# Patient Record
Sex: Female | Born: 1950 | Race: White | Hispanic: No | Smoking: Never smoker
Health system: Southern US, Community
[De-identification: ages and names within clinical notes are randomized; demographics above are authoritative.]

## PROBLEM LIST (undated history)

## (undated) DIAGNOSIS — K589 Irritable bowel syndrome without diarrhea: Secondary | ICD-10-CM

## (undated) DIAGNOSIS — K635 Polyp of colon: Secondary | ICD-10-CM

## (undated) DIAGNOSIS — M549 Dorsalgia, unspecified: Secondary | ICD-10-CM

## (undated) DIAGNOSIS — N200 Calculus of kidney: Secondary | ICD-10-CM

## (undated) DIAGNOSIS — M792 Neuralgia and neuritis, unspecified: Secondary | ICD-10-CM

## (undated) DIAGNOSIS — M25569 Pain in unspecified knee: Secondary | ICD-10-CM

## (undated) DIAGNOSIS — M2550 Pain in unspecified joint: Secondary | ICD-10-CM

## (undated) DIAGNOSIS — M1990 Unspecified osteoarthritis, unspecified site: Secondary | ICD-10-CM

## (undated) DIAGNOSIS — F329 Major depressive disorder, single episode, unspecified: Secondary | ICD-10-CM

## (undated) DIAGNOSIS — R918 Other nonspecific abnormal finding of lung field: Secondary | ICD-10-CM

## (undated) DIAGNOSIS — E785 Hyperlipidemia, unspecified: Secondary | ICD-10-CM

## (undated) DIAGNOSIS — I10 Essential (primary) hypertension: Secondary | ICD-10-CM

## (undated) DIAGNOSIS — K219 Gastro-esophageal reflux disease without esophagitis: Secondary | ICD-10-CM

## (undated) DIAGNOSIS — F32A Depression, unspecified: Secondary | ICD-10-CM

## (undated) HISTORY — DX: Unspecified osteoarthritis, unspecified site: M19.90

## (undated) HISTORY — DX: Polyp of colon: K63.5

## (undated) HISTORY — DX: Other nonspecific abnormal finding of lung field: R91.8

## (undated) HISTORY — DX: Calculus of kidney: N20.0

## (undated) HISTORY — DX: Pain in unspecified joint: M25.50

## (undated) HISTORY — DX: Essential (primary) hypertension: I10

## (undated) HISTORY — DX: Hyperlipidemia, unspecified: E78.5

## (undated) HISTORY — PX: BREAST REDUCTION SURGERY: SHX8

## (undated) HISTORY — DX: Dorsalgia, unspecified: M54.9

## (undated) HISTORY — PX: CARPAL TUNNEL RELEASE: SHX101

## (undated) HISTORY — PX: LUMBAR LAMINECTOMY: SHX95

## (undated) HISTORY — DX: Major depressive disorder, single episode, unspecified: F32.9

## (undated) HISTORY — DX: Irritable bowel syndrome, unspecified: K58.9

## (undated) HISTORY — PX: CHOLECYSTECTOMY: SHX55

## (undated) HISTORY — DX: Depression, unspecified: F32.A

## (undated) HISTORY — DX: Pain in unspecified knee: M25.569

## (undated) HISTORY — DX: Gastro-esophageal reflux disease without esophagitis: K21.9

## (undated) HISTORY — DX: Neuralgia and neuritis, unspecified: M79.2

---

## 1998-06-02 ENCOUNTER — Other Ambulatory Visit: Admission: RE | Admit: 1998-06-02 | Discharge: 1998-06-02 | Payer: Self-pay | Admitting: Obstetrics and Gynecology

## 2001-05-25 ENCOUNTER — Encounter (INDEPENDENT_AMBULATORY_CARE_PROVIDER_SITE_OTHER): Payer: Self-pay

## 2001-05-25 ENCOUNTER — Ambulatory Visit (HOSPITAL_COMMUNITY): Admission: RE | Admit: 2001-05-25 | Discharge: 2001-05-26 | Payer: Self-pay | Admitting: General Surgery

## 2001-05-25 ENCOUNTER — Encounter: Payer: Self-pay | Admitting: General Surgery

## 2002-01-03 ENCOUNTER — Encounter: Payer: Self-pay | Admitting: Neurosurgery

## 2002-01-03 ENCOUNTER — Ambulatory Visit (HOSPITAL_COMMUNITY): Admission: RE | Admit: 2002-01-03 | Discharge: 2002-01-03 | Payer: Self-pay | Admitting: Neurosurgery

## 2002-01-09 ENCOUNTER — Encounter: Payer: Self-pay | Admitting: Neurosurgery

## 2002-01-09 ENCOUNTER — Observation Stay (HOSPITAL_COMMUNITY): Admission: RE | Admit: 2002-01-09 | Discharge: 2002-01-10 | Payer: Self-pay | Admitting: Neurosurgery

## 2002-02-21 ENCOUNTER — Other Ambulatory Visit: Admission: RE | Admit: 2002-02-21 | Discharge: 2002-02-21 | Payer: Self-pay | Admitting: *Deleted

## 2003-02-03 ENCOUNTER — Ambulatory Visit (HOSPITAL_COMMUNITY): Admission: RE | Admit: 2003-02-03 | Discharge: 2003-02-03 | Payer: Self-pay | Admitting: Specialist

## 2003-02-03 ENCOUNTER — Encounter (INDEPENDENT_AMBULATORY_CARE_PROVIDER_SITE_OTHER): Payer: Self-pay | Admitting: Specialist

## 2003-02-03 ENCOUNTER — Ambulatory Visit (HOSPITAL_BASED_OUTPATIENT_CLINIC_OR_DEPARTMENT_OTHER): Admission: RE | Admit: 2003-02-03 | Discharge: 2003-02-03 | Payer: Self-pay | Admitting: Specialist

## 2003-02-26 ENCOUNTER — Other Ambulatory Visit: Admission: RE | Admit: 2003-02-26 | Discharge: 2003-02-26 | Payer: Self-pay | Admitting: Obstetrics and Gynecology

## 2004-04-04 HISTORY — PX: LUMBAR FUSION: SHX111

## 2004-09-08 ENCOUNTER — Emergency Department (HOSPITAL_COMMUNITY): Admission: EM | Admit: 2004-09-08 | Discharge: 2004-09-08 | Payer: Self-pay | Admitting: Emergency Medicine

## 2004-12-09 ENCOUNTER — Ambulatory Visit: Payer: Self-pay | Admitting: Family Medicine

## 2004-12-14 ENCOUNTER — Ambulatory Visit: Payer: Self-pay | Admitting: Family Medicine

## 2004-12-16 ENCOUNTER — Ambulatory Visit: Payer: Self-pay | Admitting: Gastroenterology

## 2004-12-21 ENCOUNTER — Ambulatory Visit: Payer: Self-pay | Admitting: Family Medicine

## 2004-12-30 ENCOUNTER — Ambulatory Visit: Payer: Self-pay | Admitting: Gastroenterology

## 2004-12-30 ENCOUNTER — Encounter (INDEPENDENT_AMBULATORY_CARE_PROVIDER_SITE_OTHER): Payer: Self-pay | Admitting: *Deleted

## 2005-01-10 ENCOUNTER — Ambulatory Visit: Payer: Self-pay | Admitting: Family Medicine

## 2005-02-28 ENCOUNTER — Inpatient Hospital Stay (HOSPITAL_COMMUNITY): Admission: RE | Admit: 2005-02-28 | Discharge: 2005-03-04 | Payer: Self-pay | Admitting: Neurosurgery

## 2006-02-20 ENCOUNTER — Ambulatory Visit: Payer: Self-pay | Admitting: Family Medicine

## 2006-02-20 LAB — CONVERTED CEMR LAB
ALT: 19 units/L (ref 0–40)
AST: 26 units/L (ref 0–37)
Albumin: 4 g/dL (ref 3.5–5.2)
Alkaline Phosphatase: 93 units/L (ref 39–117)
BUN: 18 mg/dL (ref 6–23)
Basophils Absolute: 0 10*3/uL (ref 0.0–0.1)
Basophils Relative: 0.5 % (ref 0.0–1.0)
CO2: 29 meq/L (ref 19–32)
Calcium: 9.4 mg/dL (ref 8.4–10.5)
Chloride: 106 meq/L (ref 96–112)
Chol/HDL Ratio, serum: 4.1
Cholesterol: 205 mg/dL (ref 0–200)
Creatinine, Ser: 0.8 mg/dL (ref 0.4–1.2)
Eosinophil percent: 5.9 % — ABNORMAL HIGH (ref 0.0–5.0)
GFR calc non Af Amer: 79 mL/min
Glomerular Filtration Rate, Af Am: 96 mL/min/{1.73_m2}
Glucose, Bld: 99 mg/dL (ref 70–99)
HCT: 41.7 % (ref 36.0–46.0)
HDL: 49.9 mg/dL (ref >39.0)
Hemoglobin: 14.3 g/dL (ref 12.0–15.0)
LDL DIRECT: 155.7 mg/dL
Lymphocytes Relative: 32.7 % (ref 12.0–46.0)
MCHC: 34.2 g/dL (ref 30.0–36.0)
MCV: 90.3 fL (ref 78.0–100.0)
Monocytes Absolute: 0.3 10*3/uL (ref 0.2–0.7)
Monocytes Relative: 5 % (ref 3.0–11.0)
Neutro Abs: 3.7 10*3/uL (ref 1.4–7.7)
Neutrophils Relative %: 55.9 % (ref 43.0–77.0)
Platelets: 277 10*3/uL (ref 150–400)
Potassium: 4.7 meq/L (ref 3.5–5.1)
RBC: 4.61 M/uL (ref 3.87–5.11)
RDW: 13.1 % (ref 11.5–14.6)
Sodium: 141 meq/L (ref 135–145)
TSH: 3.36 microintl units/mL (ref 0.35–5.50)
Total Bilirubin: 0.7 mg/dL (ref 0.3–1.2)
Total Protein: 7 g/dL (ref 6.0–8.3)
Triglyceride fasting, serum: 55 mg/dL (ref 0–149)
VLDL: 11 mg/dL (ref 0–40)
WBC: 6.5 10*3/uL (ref 4.5–10.5)

## 2006-02-28 ENCOUNTER — Other Ambulatory Visit: Admission: RE | Admit: 2006-02-28 | Discharge: 2006-02-28 | Payer: Self-pay | Admitting: Family Medicine

## 2006-02-28 ENCOUNTER — Encounter: Payer: Self-pay | Admitting: Family Medicine

## 2006-02-28 ENCOUNTER — Ambulatory Visit: Payer: Self-pay | Admitting: Family Medicine

## 2006-03-16 ENCOUNTER — Ambulatory Visit: Payer: Self-pay | Admitting: Family Medicine

## 2006-06-13 ENCOUNTER — Ambulatory Visit: Payer: Self-pay | Admitting: Family Medicine

## 2006-06-27 ENCOUNTER — Ambulatory Visit: Payer: Self-pay | Admitting: Family Medicine

## 2006-06-27 LAB — CONVERTED CEMR LAB
ALT: 21 units/L (ref 0–40)
AST: 27 units/L (ref 0–37)
Albumin: 4 g/dL (ref 3.5–5.2)
Alkaline Phosphatase: 95 units/L (ref 39–117)
Bilirubin, Direct: 0.1 mg/dL (ref 0.0–0.3)
Cholesterol: 143 mg/dL (ref 0–200)
HDL: 45.8 mg/dL (ref >39.0)
LDL Cholesterol: 73 mg/dL (ref 0–99)
Total Bilirubin: 0.6 mg/dL (ref 0.3–1.2)
Total CHOL/HDL Ratio: 3.1
Total Protein: 6.9 g/dL (ref 6.0–8.3)
Triglycerides: 123 mg/dL (ref 0–149)
VLDL: 25 mg/dL (ref 0–40)

## 2006-12-18 ENCOUNTER — Encounter: Payer: Self-pay | Admitting: Family Medicine

## 2007-01-05 ENCOUNTER — Encounter: Payer: Self-pay | Admitting: Family Medicine

## 2007-01-10 ENCOUNTER — Ambulatory Visit: Payer: Self-pay | Admitting: Family Medicine

## 2007-01-10 DIAGNOSIS — R42 Dizziness and giddiness: Secondary | ICD-10-CM

## 2007-01-10 DIAGNOSIS — K219 Gastro-esophageal reflux disease without esophagitis: Secondary | ICD-10-CM

## 2007-01-10 DIAGNOSIS — B351 Tinea unguium: Secondary | ICD-10-CM

## 2007-02-08 ENCOUNTER — Telehealth: Payer: Self-pay | Admitting: Family Medicine

## 2007-02-21 ENCOUNTER — Ambulatory Visit: Payer: Self-pay | Admitting: Family Medicine

## 2007-05-18 ENCOUNTER — Ambulatory Visit: Payer: Self-pay | Admitting: Family Medicine

## 2007-05-18 DIAGNOSIS — F332 Major depressive disorder, recurrent severe without psychotic features: Secondary | ICD-10-CM | POA: Insufficient documentation

## 2007-05-29 ENCOUNTER — Encounter: Payer: Self-pay | Admitting: Family Medicine

## 2007-05-30 ENCOUNTER — Encounter: Payer: Self-pay | Admitting: Family Medicine

## 2007-06-07 ENCOUNTER — Ambulatory Visit: Payer: Self-pay | Admitting: Family Medicine

## 2007-06-07 LAB — CONVERTED CEMR LAB
Bilirubin Urine: NEGATIVE
Glucose, Urine, Semiquant: NEGATIVE
Ketones, urine, test strip: NEGATIVE
Nitrite: NEGATIVE
Specific Gravity, Urine: 1.025
Urobilinogen, UA: 0.2
pH: 6

## 2007-06-08 LAB — CONVERTED CEMR LAB
ALT: 20 units/L (ref 0–35)
AST: 28 units/L (ref 0–37)
Albumin: 4 g/dL (ref 3.5–5.2)
Alkaline Phosphatase: 90 units/L (ref 39–117)
BUN: 16 mg/dL (ref 6–23)
Basophils Absolute: 0.1 10*3/uL (ref 0.0–0.1)
Basophils Relative: 1.2 % — ABNORMAL HIGH (ref 0.0–1.0)
Bilirubin, Direct: 0.2 mg/dL (ref 0.0–0.3)
CO2: 28 meq/L (ref 19–32)
Calcium: 9.5 mg/dL (ref 8.4–10.5)
Chloride: 107 meq/L (ref 96–112)
Cholesterol: 142 mg/dL (ref 0–200)
Creatinine, Ser: 0.7 mg/dL (ref 0.4–1.2)
Eosinophils Absolute: 0.4 10*3/uL (ref 0.0–0.6)
Eosinophils Relative: 6.3 % — ABNORMAL HIGH (ref 0.0–5.0)
GFR calc Af Amer: 111 mL/min
GFR calc non Af Amer: 92 mL/min
Glucose, Bld: 96 mg/dL (ref 70–99)
HCT: 40.8 % (ref 36.0–46.0)
HDL: 45.6 mg/dL (ref >39.0)
Hemoglobin: 13.9 g/dL (ref 12.0–15.0)
LDL Cholesterol: 77 mg/dL (ref 0–99)
Lymphocytes Relative: 25.2 % (ref 12.0–46.0)
MCHC: 34.1 g/dL (ref 30.0–36.0)
MCV: 90.4 fL (ref 78.0–100.0)
Monocytes Absolute: 0.4 10*3/uL (ref 0.2–0.7)
Monocytes Relative: 6 % (ref 3.0–11.0)
Neutro Abs: 4 10*3/uL (ref 1.4–7.7)
Neutrophils Relative %: 61.3 % (ref 43.0–77.0)
Platelets: 240 10*3/uL (ref 150–400)
Potassium: 4.1 meq/L (ref 3.5–5.1)
RBC: 4.51 M/uL (ref 3.87–5.11)
RDW: 12.3 % (ref 11.5–14.6)
Sodium: 141 meq/L (ref 135–145)
TSH: 2.91 microintl units/mL (ref 0.35–5.50)
Total Bilirubin: 0.8 mg/dL (ref 0.3–1.2)
Total CHOL/HDL Ratio: 3.1
Total Protein: 6.7 g/dL (ref 6.0–8.3)
Triglycerides: 98 mg/dL (ref 0–149)
VLDL: 20 mg/dL (ref 0–40)
WBC: 6.6 10*3/uL (ref 4.5–10.5)

## 2007-06-18 ENCOUNTER — Encounter: Payer: Self-pay | Admitting: Family Medicine

## 2007-06-18 ENCOUNTER — Other Ambulatory Visit: Admission: RE | Admit: 2007-06-18 | Discharge: 2007-06-18 | Payer: Self-pay | Admitting: Family Medicine

## 2007-06-18 ENCOUNTER — Ambulatory Visit: Payer: Self-pay | Admitting: Family Medicine

## 2007-06-18 DIAGNOSIS — F329 Major depressive disorder, single episode, unspecified: Secondary | ICD-10-CM

## 2007-06-18 DIAGNOSIS — E785 Hyperlipidemia, unspecified: Secondary | ICD-10-CM

## 2007-06-18 DIAGNOSIS — M1990 Unspecified osteoarthritis, unspecified site: Secondary | ICD-10-CM

## 2007-11-26 ENCOUNTER — Encounter: Payer: Self-pay | Admitting: Family Medicine

## 2007-11-26 ENCOUNTER — Telehealth: Payer: Self-pay | Admitting: Family Medicine

## 2008-01-08 ENCOUNTER — Telehealth: Payer: Self-pay | Admitting: Family Medicine

## 2008-01-10 ENCOUNTER — Ambulatory Visit: Payer: Self-pay | Admitting: Family Medicine

## 2008-01-14 LAB — CONVERTED CEMR LAB
Free T4: 0.7 ng/dL (ref 0.6–1.6)
T3, Free: 2.8 pg/mL (ref 2.3–4.2)
TSH: 4.36 microintl units/mL (ref 0.35–5.50)

## 2008-04-01 ENCOUNTER — Telehealth: Payer: Self-pay | Admitting: Family Medicine

## 2008-06-19 ENCOUNTER — Encounter: Payer: Self-pay | Admitting: Family Medicine

## 2008-12-16 ENCOUNTER — Ambulatory Visit: Payer: Self-pay | Admitting: Family Medicine

## 2008-12-16 LAB — CONVERTED CEMR LAB
Bilirubin Urine: NEGATIVE
Blood in Urine, dipstick: NEGATIVE
Glucose, Urine, Semiquant: NEGATIVE
Ketones, urine, test strip: NEGATIVE
Nitrite: NEGATIVE
Protein, U semiquant: NEGATIVE
Specific Gravity, Urine: 1.02
Urobilinogen, UA: 0.2
WBC Urine, dipstick: NEGATIVE
pH: 7

## 2008-12-18 LAB — CONVERTED CEMR LAB
ALT: 26 units/L (ref 0–35)
AST: 33 units/L (ref 0–37)
Albumin: 4 g/dL (ref 3.5–5.2)
Alkaline Phosphatase: 77 units/L (ref 39–117)
BUN: 22 mg/dL (ref 6–23)
Basophils Absolute: 0.1 10*3/uL (ref 0.0–0.1)
Basophils Relative: 1.5 % (ref 0.0–3.0)
Bilirubin, Direct: 0 mg/dL (ref 0.0–0.3)
CO2: 32 meq/L (ref 19–32)
Calcium: 9.4 mg/dL (ref 8.4–10.5)
Chloride: 108 meq/L (ref 96–112)
Cholesterol: 164 mg/dL (ref 0–200)
Creatinine, Ser: 0.6 mg/dL (ref 0.4–1.2)
Eosinophils Absolute: 0.4 10*3/uL (ref 0.0–0.7)
Eosinophils Relative: 6.7 % — ABNORMAL HIGH (ref 0.0–5.0)
GFR calc non Af Amer: 109.23 mL/min (ref >60)
Glucose, Bld: 96 mg/dL (ref 70–99)
HCT: 40.5 % (ref 36.0–46.0)
HDL: 55.4 mg/dL (ref >39.00)
Hemoglobin: 13.8 g/dL (ref 12.0–15.0)
LDL Cholesterol: 97 mg/dL (ref 0–99)
Lymphocytes Relative: 33.1 % (ref 12.0–46.0)
Lymphs Abs: 2 10*3/uL (ref 0.7–4.0)
MCHC: 34.1 g/dL (ref 30.0–36.0)
MCV: 92.5 fL (ref 78.0–100.0)
Monocytes Absolute: 0.4 10*3/uL (ref 0.1–1.0)
Monocytes Relative: 6.3 % (ref 3.0–12.0)
Neutro Abs: 3 10*3/uL (ref 1.4–7.7)
Neutrophils Relative %: 52.4 % (ref 43.0–77.0)
Platelets: 233 10*3/uL (ref 150.0–400.0)
Potassium: 4.4 meq/L (ref 3.5–5.1)
RBC: 4.38 M/uL (ref 3.87–5.11)
RDW: 12.1 % (ref 11.5–14.6)
Sodium: 145 meq/L (ref 135–145)
TSH: 3.33 microintl units/mL (ref 0.35–5.50)
Total Bilirubin: 0.7 mg/dL (ref 0.3–1.2)
Total CHOL/HDL Ratio: 3
Total Protein: 7.2 g/dL (ref 6.0–8.3)
Triglycerides: 58 mg/dL (ref 0.0–149.0)
VLDL: 11.6 mg/dL (ref 0.0–40.0)
WBC: 5.9 10*3/uL (ref 4.5–10.5)

## 2008-12-23 ENCOUNTER — Ambulatory Visit: Payer: Self-pay | Admitting: Family Medicine

## 2009-01-12 ENCOUNTER — Ambulatory Visit: Payer: Self-pay | Admitting: Family Medicine

## 2009-06-25 ENCOUNTER — Encounter: Payer: Self-pay | Admitting: Family Medicine

## 2009-11-12 ENCOUNTER — Encounter (INDEPENDENT_AMBULATORY_CARE_PROVIDER_SITE_OTHER): Payer: Self-pay | Admitting: *Deleted

## 2009-11-17 ENCOUNTER — Ambulatory Visit: Payer: Self-pay | Admitting: Family Medicine

## 2009-11-19 ENCOUNTER — Ambulatory Visit: Payer: Self-pay | Admitting: Family Medicine

## 2009-12-08 ENCOUNTER — Encounter (INDEPENDENT_AMBULATORY_CARE_PROVIDER_SITE_OTHER): Payer: Self-pay | Admitting: *Deleted

## 2009-12-25 ENCOUNTER — Telehealth: Payer: Self-pay | Admitting: Family Medicine

## 2009-12-25 ENCOUNTER — Ambulatory Visit: Payer: Self-pay | Admitting: Internal Medicine

## 2009-12-25 ENCOUNTER — Ambulatory Visit: Payer: Self-pay | Admitting: Family Medicine

## 2009-12-25 ENCOUNTER — Other Ambulatory Visit: Admission: RE | Admit: 2009-12-25 | Discharge: 2009-12-25 | Payer: Self-pay | Admitting: Family Medicine

## 2009-12-25 DIAGNOSIS — R1031 Right lower quadrant pain: Secondary | ICD-10-CM

## 2009-12-25 DIAGNOSIS — J984 Other disorders of lung: Secondary | ICD-10-CM

## 2009-12-25 LAB — CONVERTED CEMR LAB
Bilirubin Urine: NEGATIVE
Glucose, Urine, Semiquant: NEGATIVE
Ketones, urine, test strip: NEGATIVE
Nitrite: NEGATIVE
Specific Gravity, Urine: 1.01
Urobilinogen, UA: 0.2
pH: 6

## 2009-12-28 LAB — CONVERTED CEMR LAB
ALT: 18 units/L (ref 0–35)
AST: 27 units/L (ref 0–37)
Albumin: 4.2 g/dL (ref 3.5–5.2)
Alkaline Phosphatase: 78 units/L (ref 39–117)
Amylase: 22 units/L — ABNORMAL LOW (ref 27–131)
BUN: 19 mg/dL (ref 6–23)
Basophils Absolute: 0.1 10*3/uL (ref 0.0–0.1)
Basophils Relative: 1.1 % (ref 0.0–3.0)
Bilirubin, Direct: 0.1 mg/dL (ref 0.0–0.3)
CO2: 25 meq/L (ref 19–32)
Calcium: 9.2 mg/dL (ref 8.4–10.5)
Chloride: 101 meq/L (ref 96–112)
Creatinine, Ser: 0.6 mg/dL (ref 0.4–1.2)
Eosinophils Absolute: 0.6 10*3/uL (ref 0.0–0.7)
Eosinophils Relative: 5.9 % — ABNORMAL HIGH (ref 0.0–5.0)
GFR calc non Af Amer: 102.88 mL/min (ref >60)
Glucose, Bld: 88 mg/dL (ref 70–99)
HCT: 40.5 % (ref 36.0–46.0)
Hemoglobin: 13.9 g/dL (ref 12.0–15.0)
Lymphocytes Relative: 21.9 % (ref 12.0–46.0)
Lymphs Abs: 2.2 10*3/uL (ref 0.7–4.0)
MCHC: 34.5 g/dL (ref 30.0–36.0)
MCV: 91.2 fL (ref 78.0–100.0)
Monocytes Absolute: 0.6 10*3/uL (ref 0.1–1.0)
Monocytes Relative: 5.5 % (ref 3.0–12.0)
Neutro Abs: 6.6 10*3/uL (ref 1.4–7.7)
Neutrophils Relative %: 65.6 % (ref 43.0–77.0)
Platelets: 286 10*3/uL (ref 150.0–400.0)
Potassium: 3.9 meq/L (ref 3.5–5.1)
RBC: 4.44 M/uL (ref 3.87–5.11)
RDW: 12.9 % (ref 11.5–14.6)
Sodium: 138 meq/L (ref 135–145)
TSH: 2.77 microintl units/mL (ref 0.35–5.50)
Total Bilirubin: 0.4 mg/dL (ref 0.3–1.2)
Total Protein: 7.2 g/dL (ref 6.0–8.3)
WBC: 10.1 10*3/uL (ref 4.5–10.5)

## 2009-12-29 LAB — CONVERTED CEMR LAB: Pap Smear: NEGATIVE

## 2010-01-05 ENCOUNTER — Ambulatory Visit: Payer: Self-pay | Admitting: Family Medicine

## 2010-01-05 LAB — CONVERTED CEMR LAB
Bilirubin Urine: NEGATIVE
Glucose, Urine, Semiquant: NEGATIVE
Ketones, urine, test strip: NEGATIVE
Nitrite: NEGATIVE
Protein, U semiquant: NEGATIVE
Specific Gravity, Urine: <1.005
Urobilinogen, UA: 0.2
WBC Urine, dipstick: NEGATIVE
pH: 5.5

## 2010-01-07 LAB — CONVERTED CEMR LAB
Cholesterol: 151 mg/dL (ref 0–200)
HDL: 52.9 mg/dL (ref >39.00)
LDL Cholesterol: 81 mg/dL (ref 0–99)
Total CHOL/HDL Ratio: 3
Triglycerides: 86 mg/dL (ref 0.0–149.0)
VLDL: 17.2 mg/dL (ref 0.0–40.0)

## 2010-01-13 ENCOUNTER — Ambulatory Visit: Payer: Self-pay | Admitting: Family Medicine

## 2010-01-13 ENCOUNTER — Encounter: Payer: Self-pay | Admitting: Family Medicine

## 2010-01-13 DIAGNOSIS — N200 Calculus of kidney: Secondary | ICD-10-CM | POA: Insufficient documentation

## 2010-01-14 ENCOUNTER — Encounter (INDEPENDENT_AMBULATORY_CARE_PROVIDER_SITE_OTHER): Payer: Self-pay

## 2010-01-18 ENCOUNTER — Encounter: Payer: Self-pay | Admitting: Family Medicine

## 2010-01-18 ENCOUNTER — Ambulatory Visit: Payer: Self-pay | Admitting: Gastroenterology

## 2010-01-29 ENCOUNTER — Telehealth (INDEPENDENT_AMBULATORY_CARE_PROVIDER_SITE_OTHER): Payer: Self-pay | Admitting: *Deleted

## 2010-02-04 ENCOUNTER — Ambulatory Visit: Payer: Self-pay | Admitting: Gastroenterology

## 2010-02-18 ENCOUNTER — Ambulatory Visit (HOSPITAL_COMMUNITY): Admission: RE | Admit: 2010-02-18 | Discharge: 2010-02-18 | Payer: Self-pay | Admitting: Urology

## 2010-03-17 ENCOUNTER — Encounter: Payer: Self-pay | Admitting: Family Medicine

## 2010-05-04 NOTE — Letter (Signed)
Summary: [facility] Instructions  [facility]  [address] [place], [place] [address]   Phone: [phone]  Fax: [phone]       Robin Howe    Aug 03, 1950    MRN: [id number]        Procedure Day Dorna Bloom:  Lenor Coffin  02/04/10     Arrival Time:  8:30AM      Procedure Time:  9:30AM     [facility] of Procedure:                    [facility]  PREPARATION FOR COLONOSCOPY WITH MOVIPREP   Starting 5 days prior to your procedure 01/30/10 do not eat nuts, seeds, popcorn, corn, beans, peas,  salads, or any raw vegetables.  Do not take any fiber supplements (e.g. Metamucil, Citrucel, and Benefiber).  THE DAY BEFORE YOUR PROCEDURE         DATE: 02/03/10  DAY: WEDNESDAY  1.  Drink clear liquids the entire day-NO SOLID FOOD  2.  Do not drink anything colored red or purple.  Avoid juices with pulp.  No orange juice.  3.  Drink at least 64 oz. (8 glasses) of fluid/clear liquids during the day to prevent dehydration and help the prep work efficiently.  CLEAR LIQUIDS INCLUDE: Water Jello Ice Popsicles Tea (sugar ok, no milk/cream) Powdered fruit flavored drinks Coffee (sugar ok, no milk/cream) Gatorade Juice: apple, white grape, white cranberry  Lemonade Clear bullion, consomm, broth Carbonated beverages (any kind) Strained chicken noodle soup Hard Candy                             4.  In the morning, mix first dose of MoviPrep solution:    Empty 1 Pouch A and 1 Pouch B into the disposable container    Add lukewarm drinking water to the top line of the container. Mix to dissolve    Refrigerate (mixed solution should be used within 24 hrs)  5.  Begin drinking the prep at 5:00 p.m. The MoviPrep container is divided by 4 marks.   Every 15 minutes drink the solution down to the next mark (approximately 8 oz) until the full liter is complete.   6.  Follow completed prep with 16 oz of clear liquid of your choice (Nothing red or purple).   Continue to drink clear liquids until bedtime.  7.  Before going to bed, mix second dose of MoviPrep solution:    Empty 1 Pouch A and 1 Pouch B into the disposable container    Add lukewarm drinking water to the top line of the container. Mix to dissolve    Refrigerate  THE DAY OF YOUR PROCEDURE      DATE: 02/04/10  DAY: THURSDAY  Beginning at 4:30AM (5 hours before procedure):         1. Every 15 minutes, drink the solution down to the next mark (approx 8 oz) until the full liter is complete.  2. Follow completed prep with 16 oz. of clear liquid of your choice.    3. You may drink clear liquids until 7:30AM (2 HOURS BEFORE PROCEDURE).   MEDICATION INSTRUCTIONS  Unless otherwise instructed, you should take regular prescription medications with a small sip of water   as early as possible the morning of your procedure.  Diabetic patients - see separate instructions.  Stop taking Plavix or Aggrenox on  _  _  (  7 days before procedure).     Stop taking Coumadin on  _ _  (5 days before procedure).  Additional medication instructions: _         OTHER INSTRUCTIONS  You will need a responsible adult at least 60 years of age to accompany you and drive you home.   This person must remain in the waiting room during your procedure.  Wear loose fitting clothing that is easily removed.  Leave jewelry and other valuables at home.  However, you may wish to bring a book to read or  an iPod/MP3 player to listen to music as you wait for your procedure to start.  Remove all body piercing jewelry and leave at home.  Total time from sign-in until discharge is approximately 2-3 hours.  You should go home directly after your procedure and rest.  You can resume normal activities the  day after your procedure.  The day of your procedure you should not:   Drive   Make legal decisions   Operate machinery   Drink alcohol   Return to work  You will receive specific instructions  about eating, activities and medications before you leave.    The above instructions have been reviewed and explained to me by   _______________________    I fully understand and can verbalize these instructions _____________________________ Date _________

## 2010-05-04 NOTE — Assessment & Plan Note (Signed)
Summary: form completion---tb skin test//ccm   Vital Signs:  Patient profile:   60 year old female Weight:      193 pounds BMI:     40.83 BP sitting:   120 / 80  (left arm) Cuff size:   regular  Vitals Entered By: Raechel Ache, RN (November 17, 2009 8:30 AM) CC: Needs form for teaching.   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 She feels fine and has no concerns. She stopped sertraline about 2 months ago, and her moods are fine.   Allergies: 1)  ! * Bextra  Past History:  Past Medical History: Reviewed history from 06/18/2007 and no changes required. Hyperlipidemia Depression IBS GERD Osteoarthritis  Review of Systems  The patient denies anorexia, fever, weight loss, vision loss, decreased hearing, hoarseness, chest pain, syncope, dyspnea on exertion, peripheral edema, prolonged cough, headaches, hemoptysis, abdominal pain, melena, hematochezia, severe indigestion/heartburn, hematuria, incontinence, genital sores, muscle weakness, suspicious skin lesions, transient blindness, difficulty walking, depression, unusual weight change, abnormal bleeding, enlarged lymph nodes, angioedema, breast masses, and testicular masses.    Physical Exam  General:  overweight-appearing.   Neck:  No deformities, masses, or tenderness noted. Lungs:  Normal respiratory effort, chest expands symmetrically. Lungs are clear to auscultation, no crackles or wheezes. Heart:  Normal rate and regular rhythm. S1 and S2 normal without gallop, murmur, click, rub or other extra sounds.   Impression & Recommendations:  Problem # 1:  MORBID OBESITY (ICD-278.01)  Problem # 2:  DEPRESSION (ICD-311)  The following medications were removed from the medication list:    Sertraline Hcl 50 Mg Tabs (Sertraline hcl) ..... Once daily  Complete Medication List: 1)  Crestor 20 Mg Tabs (Rosuvastatin calcium) .Marland Kitchen.. 1 tablet by mouth daily 2)  Omeprazole 20 Mg Cpdr  (Omeprazole) .... One by mouth daily 3)  Fish Oil Oil (Fish oil) .... Once daily 4)  Vitamin B-12 1000 Mcg Tabs (Cyanocobalamin) .... Once daily 5)  Green Tea 250 Mg Caps (Green tea (camillia sinensis)) .... Once daily  Other Orders: TB Skin Test [phone]) Admin 1st Vaccine (91478)  Patient Instructions: 1)  to return in 48 hours to read the PPD. Set up a cpx soon   Immunizations Administered:  PPD Skin Test:    Vaccine Type: PPD    Site: left forearm    Mfr: Sanofi Pasteur    Dose: 0.1 ml    Route: ID    Given by: Raechel Ache, RN    Exp. Date: 02/04/2011    Lot #: [id number]

## 2010-05-04 NOTE — Consult Note (Signed)
Summary: [facility]  [facility]   Imported By: Maryln Gottron 01/26/2010 13:13:57  _____________________________________________________________________  External Attachment:    Type:   Image     Comment:   External Document

## 2010-05-04 NOTE — Progress Notes (Signed)
Summary: Pharmacy did not receive prep  ---- Converted from flag ---- ---- 01/29/2010 9:23 AM, Vallarie Mare wrote: [facility] [phone] did not receive prep order ------------------------------  Appended Document: Pharmacy did not receive prep [facility] [address] [place], [place].

## 2010-05-04 NOTE — Assessment & Plan Note (Signed)
Summary: tb reading/cb  Nurse Visit   Vitals Entered By: Raechel Ache, RN (November 19, 2009 9:15 AM)  Allergies: 1)  ! * Bextra  PPD Results    Date of reading: 11/19/2009    Results: < 5mm    Interpretation: negative

## 2010-05-04 NOTE — Progress Notes (Signed)
Summary: call report radiology   Phone Note From Other Clinic   Caller: [facility]  Summary of Call: call report from [place] from radiology  ___ 4mm obstructing stone.  [facility].  instructed pt to use coffee filters to strain urine and lots of H2O. Initial call taken by: Pura Spice, RN,  December 25, 2009 2:05 PM  Follow-up for Phone Call        call  her in some Vicodin 5/500 q 6 hours as needed pain , #60 with no rf Follow-up by: Nelwyn Salisbury MD,  December 25, 2009 3:11 PM  Additional Follow-up for Phone Call Additional follow up Details #1::        done pt awaree. Additional Follow-up by: Pura Spice, RN,  December 25, 2009 3:44 PM    New/Updated Medications: VICODIN 5-500 MG TABS (HYDROCODONE-ACETAMINOPHEN) 1 by mouth every 6 hrs pain Prescriptions: VICODIN 5-500 MG TABS (HYDROCODONE-ACETAMINOPHEN) 1 by mouth every 6 hrs pain  #60 x 0   Entered by:   Pura Spice, RN   Authorized by:   Nelwyn Salisbury MD   Signed by:   Pura Spice, RN on 12/25/2009   Method used:   Telephoned to ...       [facility]  [address] (retail)       [address]       [place], [place]  [address]       Ph: [phone] or [phone]       Fax: [phone]   RxID:   [id number]

## 2010-05-04 NOTE — Letter (Signed)
Summary: Colonoscopy Letter  [facility]  [address] [place], [place] [address]   Phone: [phone]  Fax: [phone]      November 12, 2009 MRN: [id number]   [facility] [address] [place], [place]  [address]   Dear Robin Howe,   According to your medical record, it is time for you to schedule a Colonoscopy. The American Cancer Society recommends this procedure as a method to detect early colon cancer. Patients with a family history of colon cancer, or a personal history of colon polyps or inflammatory bowel disease are at increased risk.  This letter has beeen generated based on the recommendations made at the time of your procedure. If you feel that in your particular situation this may no longer apply, please contact our office.  Please call our office at [phone] to schedule this appointment or to update your records at your earliest convenience.  Thank you for cooperating with [place] to provide you with the very best care possible.   Sincerely,  Judie Petit T. Russella Dar, M.D.  [facility] [phone]

## 2010-05-04 NOTE — Assessment & Plan Note (Signed)
Summary: right lower quad pain//ccm   Vital Signs:  Patient profile:   60 year old female Menstrual status:  postmenopausal Weight:      196 pounds O2 Sat:      95 % Temp:     98.4 degrees F Pulse rate:   90 / minute BP sitting:   130 / 90  Vitals Entered By: Pura Spice, RN (December 25, 2009 8:51 AM) CC: RLQ pain worse after eating  Is Patient Diabetic? No     Menstrual Status postmenopausal   History of Present Illness: Here for one week of intermittent RLQ and right flank pains. These are severe at times, and milder at other times. The pain seems to get worse after she eats. Appetite is normal, no nausea, no fever. Her BMs are normal, soft, has 1-2 a day. No change in urinations. She is fasting this am. She is scheduled  for a colonoscopy in November.   Allergies: 1)  ! * Bextra  Past History:  Past Medical History: Reviewed history from 06/18/2007 and no changes required. Hyperlipidemia Depression IBS GERD Osteoarthritis  Past Surgical History: Reviewed history from 06/18/2007 and no changes required. Carpal tunnel releases, bilateral , per Dr. Mikal Plane Caesarean section colonoscopy 12-30-04 per Dr. Russella Dar, repeat in 5 yrs Lumbar laminectomy Lumbar fusion per Dr. Franky Macho Cholecystectomy breast reductions  Review of Systems  The patient denies anorexia, fever, weight loss, weight gain, vision loss, decreased hearing, hoarseness, chest pain, syncope, dyspnea on exertion, peripheral edema, prolonged cough, headaches, hemoptysis, melena, hematochezia, severe indigestion/heartburn, hematuria, incontinence, genital sores, muscle weakness, suspicious skin lesions, transient blindness, difficulty walking, depression, unusual weight change, abnormal bleeding, enlarged lymph nodes, angioedema, breast masses, and testicular masses.    Physical Exam  General:  overweight-appearing.  in NAD Abdomen:  soft, normal bowel sounds, no distention, no masses, no guarding, no  rigidity, no rebound tenderness, no abdominal hernia, no inguinal hernia, no hepatomegaly, and no splenomegaly.  Moderately tender in the right flank, the RLQ, and at the umbilicus.  Rectal:  No external abnormalities noted. Normal sphincter tone. No rectal masses or tenderness. Heme neg.  Genitalia:  Pelvic Exam:        External: normal female genitalia without lesions or masses        Vagina: normal without lesions or masses        Cervix: normal without lesions or masses        Adnexa: normal bimanual exam without masses or fullness        Uterus: normal by palpation        Pap smear: performed   Impression & Recommendations:  Problem # 1:  ABDOMINAL PAIN, RIGHT LOWER QUADRANT (ICD-789.03)  Orders: Hemoccult Guaiac-1 spec.(in office) ([address]70) UA Dipstick w/o Micro (manual) ([address]) Venipuncture ([address]) TLB-BMP (Basic Metabolic Panel-BMET) (80048-METABOL) TLB-CBC Platelet - w/Differential (85025-CBCD) TLB-Hepatic/Liver Function Pnl (80076-HEPATIC) TLB-TSH (Thyroid Stimulating Hormone) (84443-TSH) TLB-Amylase (82150-AMYL)  Complete Medication List: 1)  Crestor 20 Mg Tabs (Rosuvastatin calcium) .Marland Kitchen.. 1 tablet by mouth daily 2)  Omeprazole 20 Mg Cpdr (Omeprazole) .... One by mouth daily 3)  Fish Oil Oil (Fish oil) .... Once daily 4)  Vitamin B-12 1000 Mcg Tabs (Cyanocobalamin) .... Once daily 5)  Green Tea 250 Mg Caps (Green tea (camillia sinensis)) .... Once daily  Other Orders: Radiology Referral (Radiology)  Patient Instructions: 1)  This is most likely a ureteral stone that is hung up at the right UPJ, but we need to rule out appendicitis. get labs now  and then set up a contrasted abdomen and pelvic CT.   Laboratory Results   Urine Tests  Date/Time Received: 9:40 AM  Date/Time Reported: December 25, 2009   Routine Urinalysis   Color: yellow Appearance: Clear Glucose: negative   (Normal Range: Negative) Bilirubin: negative   (Normal Range: Negative) Ketone:  negative   (Normal Range: Negative) Spec. Gravity: 1.010   (Normal Range: 1.003-1.035) Blood: large   (Normal Range: Negative) pH: 6.0   (Normal Range: 5.0-8.0) Protein: trace   (Normal Range: Negative) Urobilinogen: 0.2   (Normal Range: 0-1) Nitrite: negative   (Normal Range: Negative) Leukocyte Esterace: trace   (Normal Range: Negative)    Comments: Pura Spice, RN  December 25, 2009 9:43 AM

## 2010-05-04 NOTE — Procedures (Signed)
Summary: Colonoscopy  Patient: Robin Howe Note: All result statuses are Final unless otherwise noted.  Tests: (1) Colonoscopy (COL)   COL Colonoscopy           DONE     [facility]     [address]     [place], [place]  [address]           COLONOSCOPY PROCEDURE REPORT     PATIENT:  Robin, Howe  MR#:  [id number]     BIRTHDATE:  05-Mar-1951, 58 yrs. old  GENDER:  female     ENDOSCOPIST:  Judie Petit T. Russella Dar, MD, [facility]           PROCEDURE DATE:  02/04/2010     PROCEDURE:  Colonoscopy 10932     ASA CLASS:  Class II     INDICATIONS:  1) surveillance and high-risk screening  2) history     of adenomatous colon polyp: 12/2004.     MEDICATIONS:   Fentanyl 100 mcg IV, Versed 9 mg IV     DESCRIPTION OF PROCEDURE:   After the risks benefits and     alternatives of the procedure were thoroughly explained, informed     consent was obtained.  Digital rectal exam was performed and     revealed no abnormalities.   The LB [id number] endoscope     was introduced through the anus and advanced to the cecum, which     was identified by both the appendix and ileocecal valve, without     limitations.  The quality of the prep was excellent, using     MoviPrep.  The instrument was then slowly withdrawn as the colon     was fully examined.     <<PROCEDUREIMAGES>>     FINDINGS:  A normal appearing cecum, ileocecal valve, and     appendiceal orifice were identified. The ascending, hepatic     flexure, transverse, splenic flexure, descending, sigmoid colon,     and rectum appeared unremarkable.   Retroflexed views in the     rectum revealed no abnormalities.    The time to cecum =  3.25     minutes. The scope was then withdrawn (time =  10  min) from the     patient and the procedure completed.     COMPLICATIONS:  None           ENDOSCOPIC IMPRESSION:     1) Normal colon           RECOMMENDATIONS:     1) Repeat Colonoscopy in 5 years.           Venita Lick. Russella Dar, MD, [facility]          CC: Nelwyn Salisbury, MD           n.     Rosalie DoctorVenita Lick. Stark at 02/04/2010 09:48 AM           Hilma Favors, [id number]  Note: An exclamation mark (!) indicates a result that was not dispersed into the flowsheet. Document Creation Date: 02/04/2010 9:48 AM _______________________________________________________________________  (1) Order result status: Final Collection or observation date-time: 02/04/2010 09:44 Requested date-time:  Receipt date-time:  Reported date-time:  Referring Physician:   Ordering Physician: Claudette Head [phone]) Specimen Source:  Source: Launa Grill Order Number: [id number] Lab site:   Appended Document: Colonoscopy    Clinical Lists Changes  Observations: Added new observation of COLONNXTDUE: 02/2015 (02/04/2010 14:41)

## 2010-05-04 NOTE — Miscellaneous (Signed)
Summary: [facility] previsit prep  Clinical Lists Changes  Medications: Added new medication of MOVIPREP 100 GM  SOLR (PEG-KCL-NACL-NASULF-NA ASC-C) Observations: Added new observation of ALLERGY REV: Done (01/18/2010 8:03)

## 2010-05-04 NOTE — Assessment & Plan Note (Signed)
Summary: cpx --? pap//ccm   Vital Signs:  Patient profile:   60 year old female Menstrual status:  postmenopausal Height:      57.75 inches Weight:      197 pounds BMI:     41.68 O2 Sat:      96 % Temp:     98.3 degrees F Pulse rate:   84 / minute BP sitting:   140 / 80  Vitals Entered By: Pura Spice, RN (January 13, 2010 1:24 PM) CC: cpx no pap     History of Present Illness: 60 yr old female for a cpx and to follow up a right sided ureteral stone. She was here on 12-25-09 with sharp RLQ pains. A CT was obtained which showed a 4 mm stone lodged at the right UVJ causing some obstruction. Her labs were all normal. the CT also showed numerous tiny nodules in both lung bases. We have planned to get a 6 month follow up chest CT in March 2012. She is drinking water and straining the urine, but she does not think she has passed the stone. She still gets occasional lancing type pains in the RLQ. No nausea or fever. Urinating and having BMs regularly.   Allergies: 1)  ! * Bextra  Past History:  Past Medical History: Reviewed history from 06/18/2007 and no changes required. Hyperlipidemia Depression IBS GERD Osteoarthritis  Past Surgical History: Reviewed history from 06/18/2007 and no changes required. Carpal tunnel releases, bilateral , per Dr. Mikal Plane Caesarean section colonoscopy 12-30-04 per Dr. Russella Dar, repeat in 5 yrs Lumbar laminectomy Lumbar fusion per Dr. Franky Macho Cholecystectomy breast reductions  Family History: Reviewed history from 06/18/2007 and no changes required. Family History Hypertension Family History Lung cancer Family History of Prostate CA 1st degree relative <50  Social History: Reviewed history from 06/18/2007 and no changes required. Married Never Smoked Alcohol use-no Drug use-no Regular exercise-no  Review of Systems  The patient denies anorexia, fever, weight loss, weight gain, vision loss, decreased hearing, hoarseness, chest pain,  syncope, dyspnea on exertion, peripheral edema, prolonged cough, headaches, hemoptysis, melena, hematochezia, severe indigestion/heartburn, hematuria, incontinence, genital sores, muscle weakness, suspicious skin lesions, transient blindness, difficulty walking, depression, unusual weight change, abnormal bleeding, enlarged lymph nodes, angioedema, breast masses, and testicular masses.         Flu Vaccine Consent Questions     Do you have a history of severe allergic reactions to this vaccine? no    Any prior history of allergic reactions to egg and/or gelatin? no    Do you have a sensitivity to the preservative Thimersol? no    Do you have a past history of Guillan-Barre Syndrome? no    Do you currently have an acute febrile illness? no    Have you ever had a severe reaction to latex? no    Vaccine information given and explained to patient? yes    Are you currently pregnant? no    Lot Number:[id number]   Exp Date:10/02/2010   Site Given  Left Deltoid IM Pura Spice, RN  January 13, 2010 1:25 PM   Physical Exam  General:  overweight-appearing.   Head:  Normocephalic and atraumatic without obvious abnormalities. No apparent alopecia or balding. Eyes:  No corneal or conjunctival inflammation noted. EOMI. Perrla. Funduscopic exam benign, without hemorrhages, exudates or papilledema. Vision grossly normal. Ears:  External ear exam shows no significant lesions or deformities.  Otoscopic examination reveals clear canals, tympanic membranes are intact bilaterally without bulging, retraction, inflammation  or discharge. Hearing is grossly normal bilaterally. Nose:  External nasal examination shows no deformity or inflammation. Nasal mucosa are pink and moist without lesions or exudates. Mouth:  Oral mucosa and oropharynx without lesions or exudates.  Teeth in good repair. Neck:  No deformities, masses, or tenderness noted. Chest Wall:  No deformities, masses, or tenderness noted. Breasts:  No  mass, nodules, thickening, tenderness, bulging, retraction, inflamation, nipple discharge or skin changes noted.   Lungs:  Normal respiratory effort, chest expands symmetrically. Lungs are clear to auscultation, no crackles or wheezes. Heart:  Normal rate and regular rhythm. S1 and S2 normal without gallop, murmur, click, rub or other extra sounds. EKG normal Abdomen:  Bowel sounds positive,abdomen soft and non-tender without masses, organomegaly or hernias noted. Msk:  No deformity or scoliosis noted of thoracic or lumbar spine.   Pulses:  R and L carotid,radial,femoral,dorsalis pedis and posterior tibial pulses are full and equal bilaterally Extremities:  No clubbing, cyanosis, edema, or deformity noted with normal full range of motion of all joints.   Neurologic:  No cranial nerve deficits noted. Station and gait are normal. Plantar reflexes are down-going bilaterally. DTRs are symmetrical throughout. Sensory, motor and coordinative functions appear intact. Skin:  Intact without suspicious lesions or rashes Cervical Nodes:  No lymphadenopathy noted Axillary Nodes:  No palpable lymphadenopathy Inguinal Nodes:  No significant adenopathy Psych:  Cognition and judgment appear intact. Alert and cooperative with normal attention span and concentration. No apparent delusions, illusions, hallucinations   Impression & Recommendations:  Problem # 1:  WELL ADULT EXAM (ICD-V70.0)  Orders: EKG w/ Interpretation (93000)  Problem # 2:  NEPHROLITHIASIS (ICD-592.0)  Orders: Urology Referral (Urology)  Complete Medication List: 1)  Crestor 20 Mg Tabs (Rosuvastatin calcium) .Marland Kitchen.. 1 tablet by mouth daily 2)  Omeprazole 20 Mg Cpdr (Omeprazole) .... One by mouth daily 3)  Fish Oil Oil (Fish oil) .... Once daily 4)  Vitamin B-12 1000 Mcg Tabs (Cyanocobalamin) .... Once daily 5)  Green Tea 250 Mg Caps (Green tea (camillia sinensis)) .... Once daily 6)  Vicodin 5-500 Mg Tabs (Hydrocodone-acetaminophen)  .Marland Kitchen.. 1 by mouth every 6 hrs pain  Other Orders: Admin 1st Vaccine (16109) Flu Vaccine 84yrs + (60454)  Patient Instructions: 1)  It is important that you exercise reguarly at least 20 minutes 5 times a week. If you develop chest pain, have severe difficulty breathing, or feel very tired, stop exercising immediately and seek medical attention.  2)  You need to lose weight. Consider a lower calorie diet and regular exercise.  3)  we will refer her to Urology for a retained stone Prescriptions: OMEPRAZOLE 20 MG CPDR (OMEPRAZOLE) one by mouth daily  #30 x 11   Entered and Authorized by:   Nelwyn Salisbury MD   Signed by:   Nelwyn Salisbury MD on 01/13/2010   Method used:   Electronically to        [facility] (retail)       [address]       [place], [place]  [address]       Ph: [phone] or [phone]       Fax: [phone]   RxID:   [id number] CRESTOR 20 MG TABS (ROSUVASTATIN CALCIUM) 1 tablet by mouth daily  #30 x 11   Entered and Authorized by:   Nelwyn Salisbury MD   Signed by:   Nelwyn Salisbury MD on 01/13/2010   Method used:   [facility] to  [facility] (retail)       [address]       [place], [place]  [address]       Ph: [phone] or [phone]       Fax: [phone]   RxID:   [id number]

## 2010-05-04 NOTE — Letter (Signed)
Summary: Pre Visit Letter Revised  [facility]  [address] [place], [place] [address]   Phone: [phone]  Fax: [phone]        12/08/2009 MRN: [id number] [facility] [address] [place], [place]  [address]             Procedure Date:  02/04/2010   Welcome to the Gastroenterology Division at [facility].    You are scheduled to see a nurse for your pre-procedure visit on 01/18/2010 at 8:00AM on the 3rd floor at [facility], [address]  We ask that you try to arrive at our office 15 minutes prior to your appointment time to allow for check-in.  Please take a minute to review the attached form.  If you answer "Yes" to one or more of the questions on the first page, we ask that you call the person listed at your earliest opportunity.  If you answer "No" to all of the questions, please complete the rest of the form and bring it to your appointment.    Your nurse visit will consist of discussing your medical and surgical history, your immediate family medical history, and your medications.   If you are unable to list all of your medications on the form, please bring the medication bottles to your appointment and we will list them.  We will need to be aware of both prescribed and over the counter drugs.  We will need to know exact dosage information as well.    Please be prepared to read and sign documents such as consent forms, a financial agreement, and acknowledgement forms.  If necessary, and with your consent, a friend or relative is welcome to sit-in on the nurse visit with you.  Please bring your insurance card so that we may make a copy of it.  If your insurance requires a referral to see a specialist, please bring your referral form from your primary care physician.  No co-pay is required for this nurse visit.     If you cannot keep your appointment, please call [phone] to cancel or reschedule prior to your appointment date.  This allows  [place] the opportunity to schedule an appointment for another patient in need of care.    Thank you for choosing [facility] for your medical needs.  We appreciate the opportunity to care for you.  Please visit [place] at our website  to learn more about our practice.  Sincerely, The Gastroenterology Division

## 2010-05-06 NOTE — Letter (Signed)
Summary: [facility]  [facility]   Imported By: Maryln Gottron 03/30/2010 12:41:09  _____________________________________________________________________  External Attachment:    Type:   Image     Comment:   External Document

## 2010-05-17 ENCOUNTER — Ambulatory Visit (HOSPITAL_COMMUNITY)
Admission: RE | Admit: 2010-05-17 | Discharge: 2010-05-17 | Disposition: A | Discharge status: Home / Self Care | Payer: BC Managed Care – PPO | Source: Ambulatory Visit | Attending: Urology | Admitting: Urology

## 2010-05-17 ENCOUNTER — Ambulatory Visit (HOSPITAL_COMMUNITY): Payer: BC Managed Care – PPO

## 2010-05-17 DIAGNOSIS — K219 Gastro-esophageal reflux disease without esophagitis: Secondary | ICD-10-CM | POA: Insufficient documentation

## 2010-05-17 DIAGNOSIS — Z9089 Acquired absence of other organs: Secondary | ICD-10-CM | POA: Insufficient documentation

## 2010-05-17 DIAGNOSIS — Z79899 Other long term (current) drug therapy: Secondary | ICD-10-CM | POA: Insufficient documentation

## 2010-05-17 DIAGNOSIS — N201 Calculus of ureter: Secondary | ICD-10-CM | POA: Insufficient documentation

## 2010-05-17 DIAGNOSIS — E7800 Pure hypercholesterolemia, unspecified: Secondary | ICD-10-CM | POA: Insufficient documentation

## 2010-06-22 ENCOUNTER — Telehealth: Payer: Self-pay

## 2010-06-22 DIAGNOSIS — R918 Other nonspecific abnormal finding of lung field: Secondary | ICD-10-CM

## 2010-06-22 NOTE — Telephone Encounter (Signed)
Pt called and stated needs order for chest ct From notes EMR pt had contrast ct and revealed "tiny nodules in lung bases and re commendation was to repeat chest ct 6 months.  Order sent terri Call pt at [phone]

## 2010-06-29 ENCOUNTER — Ambulatory Visit (INDEPENDENT_AMBULATORY_CARE_PROVIDER_SITE_OTHER)
Admission: RE | Admit: 2010-06-29 | Discharge: 2010-06-29 | Disposition: A | Discharge status: Home / Self Care | Payer: BC Managed Care – PPO | Source: Ambulatory Visit | Attending: Family Medicine | Admitting: Family Medicine

## 2010-06-29 ENCOUNTER — Telehealth: Payer: Self-pay

## 2010-06-29 DIAGNOSIS — R918 Other nonspecific abnormal finding of lung field: Secondary | ICD-10-CM

## 2010-06-29 MED ORDER — IOHEXOL 300 MG/ML  SOLN
80.0000 mL | Freq: Once | INTRAMUSCULAR | Status: AC | PRN
  Administered 2010-06-29: 80 mL via INTRAVENOUS

## 2010-06-29 NOTE — Telephone Encounter (Signed)
Received CT chest report on desktop and Dr Clent Ridges aware  This was follow up 6 month  From 12-25-2009  Pls review report and advise.  Thanks

## 2010-06-29 NOTE — Telephone Encounter (Signed)
Opened in error

## 2010-06-30 NOTE — Telephone Encounter (Signed)
Pt aware of ct report and verbalized to repeat in 12 months

## 2010-06-30 NOTE — Telephone Encounter (Signed)
The spots appear to be benign, but to be sure we will repeat a chest CT in 12 months

## 2010-07-23 ENCOUNTER — Encounter: Payer: Self-pay | Admitting: Family Medicine

## 2010-08-13 ENCOUNTER — Ambulatory Visit (HOSPITAL_COMMUNITY): Payer: BC Managed Care – PPO

## 2010-08-13 ENCOUNTER — Ambulatory Visit (HOSPITAL_BASED_OUTPATIENT_CLINIC_OR_DEPARTMENT_OTHER)
Admission: RE | Admit: 2010-08-13 | Discharge: 2010-08-13 | Disposition: A | Discharge status: Home / Self Care | Payer: BC Managed Care – PPO | Source: Ambulatory Visit | Attending: Urology | Admitting: Urology

## 2010-08-13 DIAGNOSIS — E7800 Pure hypercholesterolemia, unspecified: Secondary | ICD-10-CM | POA: Insufficient documentation

## 2010-08-13 DIAGNOSIS — Z981 Arthrodesis status: Secondary | ICD-10-CM | POA: Insufficient documentation

## 2010-08-13 DIAGNOSIS — N201 Calculus of ureter: Secondary | ICD-10-CM | POA: Insufficient documentation

## 2010-08-13 DIAGNOSIS — Z01812 Encounter for preprocedural laboratory examination: Secondary | ICD-10-CM | POA: Insufficient documentation

## 2010-08-13 DIAGNOSIS — Z01810 Encounter for preprocedural cardiovascular examination: Secondary | ICD-10-CM | POA: Insufficient documentation

## 2010-08-13 DIAGNOSIS — K219 Gastro-esophageal reflux disease without esophagitis: Secondary | ICD-10-CM | POA: Insufficient documentation

## 2010-08-13 LAB — POCT HEMOGLOBIN-HEMACUE: Hemoglobin: 10.8 g/dL — ABNORMAL LOW (ref 12.0–15.0)

## 2010-08-19 NOTE — Op Note (Signed)
NAMEADREAM, PARZYCH               ACCOUNT NO.:  [id number]  MEDICAL RECORD NO.:  [id number]           PATIENT TYPE:  LOCATION:                                 FACILITY:  PHYSICIAN:  Tilden Broz C. Vernie Ammons, M.D.  DATE OF BIRTH:  26-Aug-1950  DATE OF PROCEDURE:  08/13/2010 DATE OF DISCHARGE:                              OPERATIVE REPORT   PREOPERATIVE DIAGNOSIS:  Right ureteral calculus.  POSTOPERATIVE DIAGNOSIS:  Right ureteral calculus.  PROCEDURES: 1. Cystoscopy with right retrograde pyelogram including     interpretation. 2. Right ureteroscopy. 3. In situ laser lithotripsy. 4. Right double-J stent placement.  SURGEON:  Bev Drennen C. Vernie Ammons, M.D.  ANESTHESIA:  General.  SPECIMENS:  Stone given to the patient.  DRAINS:  A 5-French 24-cm Polaris stent (no string)  BLOOD LOSS:  None.  COMPLICATIONS:  None.  INDICATIONS:  The patient is a 60 year old female who began having difficulty with a right distal ureteral stone at the end of last year. She underwent lithotripsy unsuccessfully and over time did not pass the stone and therefore lithotripsy was repeated again without fragmentation of the stone.  She then was seen by me and I discussed ureteroscopic extraction of the stone with her and went over the procedure as well as its risks and complications.  She understood and elected to proceed.  DESCRIPTION OF OPERATION:  After informed consent, the patient was brought to the major OR, placed on table and administered general anesthesia and then moved to the dorsal lithotomy position.  Her genitalia was sterilely prepped and draped.  An official time-out was then performed.  A 22-French cystoscope was then introduced in the bladder.  The bladder was fully and systematically inspected and noted to be free of any tumor, stones or inflammatory lesions.  Ureteral orifices were of normal configuration and position, but the right orifice appeared to have just a very mild amount of  edema associated with it.  A 6-French open-ended ureteral catheter was then passed through the cystoscope and into the right ureteral orifice.  Full strength contrast was then injected to perform retrograde pyelogram in standard fashion. As the contrast was injected, I noted the stone as a filling defect in the distal ureter and proximal dilatation of the ureter with no other filling defects or abnormalities noted throughout the course of the ureter or within the collecting system.  A 0.038-inch floppy-tip guidewire was then passed through the open-ended catheter and up the right ureter into the area of the renal pelvis under fluoroscopic control.  I then left the guidewire in place and dilated the intramural ureter with the inner portion of the ureteral access sheath.  A 6-French rigid ureteroscope was then passed under direct vision into the bladder and into the right ureteral orifice without difficulty.  I was able to identify the stone.  I then injected 10 cc of BackStop to prevent proximal migration of the stone and used the 200-micron holmium laser fiber to fragment the stone into 3 pieces.  These were then grasped with the nitinol basket and extracted individually. Reinspection ureteroscopically revealed no injury to the ureter.  The BackStop  was left to dissolve spontaneously as I elected to place a stent.  The cystoscope was backloaded over the guidewire and the stent was passed over the guidewire into the area of the renal pelvis.  Good curl was noted in the area of the renal pelvis as the guidewire was removed. The bladder was then drained and the patient was awakened and taken to recovery room in stable and satisfactory condition.  She tolerated the procedure well and there were no intraoperative complications.  She was given written discharge instructions and a prescription for Vicodin HP #36 and Pyridium 200 mg #36 with follow up in my office in 1 week for stent  removal.     Nelli Swalley C. Vernie Ammons, M.D.     MCO/MEDQ  D:  08/13/2010  T:  08/13/2010  Job:  [id number]  Electronically Signed by Ihor Gully M.D. on 08/19/2010 06:34:33 AM

## 2010-08-20 NOTE — H&P (Signed)
Robin Howe, Robin Howe                         ACCOUNT NO.:  [id number]   MEDICAL RECORD NO.:  [id number]                   PATIENT TYPE:  INP   LOCATION:  3014                                 FACILITY:  [facility]   PHYSICIAN:  Coletta Memos, MD                    DATE OF BIRTH:  25-Aug-1950   DATE OF ADMISSION:  01/09/2002  DATE OF DISCHARGE:  01/10/2002                                HISTORY & PHYSICAL   ADMITTING DIAGNOSIS:  Mass, left L3, and lower lumbar radiculopathy.   INDICATION:  The patient is a 60 year old woman who initially presented to  my office December 31, 2001 for evaluation of pain and numbness in her  right lower extremity, back pain, both upper and lower, on the right side.  Her husband passed away recently and she has been somewhat depressed since.  She did not notice these problems until then.  She is right-handed.   PAST MEDICAL HISTORY:  Past medical history is otherwise excellent.   FAMILY HISTORY:  Mother, 10, is in good health; father, 45, is in good  health; hypertension present in the family history.   PAST SURGICAL HISTORY:  She has undergone a cholecystectomy and a cesarean  section.   ALLERGIES:  She has an allergy to BEXTRA, which causes a rash.   SOCIAL HISTORY:  She does not smoke, does drink socially, does not use  illicit drugs.  She is 5-feet tall and weighs 170 pounds, but has a pulse of  72.   REVIEW OF SYSTEMS:  Review of systems positive for leg pain, weakness, back  pain and depression.  Denies constitutional, eye, ear, nose, throat, mouth,  cardiovascular, respiratory, gastrointestinal, genitourinary, skin,  neurological, psychiatric, endocrine, hematologic and allergic problems.   MEDICATIONS:  Darvocet and Skelaxin.   PHYSICAL EXAMINATION:  GENERAL:  The patient is alert and oriented x4 and  answers all questions appropriately.  Memory, language, attention span and  fund of knowledge are normal.  She is well-kempt and in no  distress.  She  can toe walk, heel walk and do deep knee bends without difficulty.  Romberg's test is negative.  Gait is normal.  She has 5/5 strength in the  lower extremities.  Negative straight leg raising.  Reflexes 2+ at the knees  and ankles.  Toes are downgoing to plantar stimulation.  Proprioception is  intact, as is pinprick.  Normal muscle tone, bulk and coordination.  The  pupils are equal, round and reactive to light.  Full extraocular movements.  Tongue and uvula are in the midline.  Shoulder shrug is normal.  Funduscopic  exam is normal.  Hearing is intact to finger rub bilaterally.  NECK:  No cervical masses or bruits.  LUNGS:  Lungs are clear.  HEART:  Regular rhythm and rate.  No murmurs or rubs.   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  Neural foramina were not compressed and are narrow.  She had some mild  stenosis at that level.  Myelogram and post-myelogram CT were done and what  that showed was a mass which was located at the level of the pedicle of L3.  It was not clear whether this was a disk or just a mass, but this was  clearly the reason for the pain in the right lower extremity.   ASSESSMENT AND PLAN:  That being the case, and she not complaining that much  of back pain as opposed to the leg pain -- she has a long history of back  pain -- I felt that this was the reason for her pain and that it would be  the best surgical option to remove that.  It did not involve going into disk  space, therefore, I do not believe I would increase the instability at all.  She had 3 mm of movement between flexion and extension on her myelogram.  I  therefore recommended that we perform an L3 hemilaminectomy and removal of  the mass.  Risks of the procedure including bleeding, infection, no pain  relief, inability to resect the mass were discussed; she understands  and  wishes to proceed.                                               Coletta Memos, MD    KC/MEDQ  D:  01/09/2002  T:  01/11/2002  Job:  [id number]

## 2010-08-20 NOTE — Op Note (Signed)
Robin Howe, Robin Howe                         ACCOUNT NO.:  [id number]   MEDICAL RECORD NO.:  [id number]                   PATIENT TYPE:  INP   LOCATION:  3014                                 FACILITY:  [facility]   PHYSICIAN:  Coletta Memos, MD                    DATE OF BIRTH:  11-25-1950   DATE OF PROCEDURE:  01/09/2002  DATE OF DISCHARGE:                                 OPERATIVE REPORT   PREOPERATIVE DIAGNOSIS:  Mass, left L3.   POSTOPERATIVE DIAGNOSIS:  Displaced disk, L3.   PROCEDURE:  Left L3 hemilaminectomy with microdissection and diskectomy.   COMPLICATIONS:  None.   SURGEON:  Coletta Memos, M.D.   ASSISTANT:  Payton Doughty, M.D.   ANESTHESIA:  General endotracheal.   INDICATIONS:  The patient is a 60 year old.  She has a mass at L3 on  myelogram.  It is not clear if it is coming from the disk space, and I  therefore recommended and she agreed to undergo resection of the mass.   DESCRIPTION OF PROCEDURE:  The patient was brought to the operating room,  intubated, and placed under general anesthesia without difficulty.  She was  rolled prone onto a Wilson frame, and all pressure points were properly  padded.  The back was prepped, and she was draped in sterile fashion.  I  placed a spinal needle for localization.  I also infiltrated 10 cc 0.5%  lidocaine and 1:200,000 strength epinephrine into the lumbar area, where my  incision would be into the paraspinous musculature on the right side.  I  opened the skin with a #10 blade and took this down to the thoracolumbar  fascia.  I then exposed the laminae of what were L3 and L2.  I took an x-ray  to confirm that I was in the correct location.  I then proceeded with a  hemilaminectomy of L3 using a high-speed air drill.  I was surprised after  drilling out the lamina that the inferior facet of L3 was remarkably loose,  and as no ligamentous attachment had been disturbed it was evident that  there had been a pars defect that  may have been idiopathic, though I think  that that was unlikely as I was not aggressive in the medial aspect of my  drilling and she retains most, if not all, of the facet.  Nevertheless, I  exposed the thecal sac and brought the microscope into the operative field.  I was able to retract the thecal sac and initially identified no  abnormality.  The L3 nerve root going around the pedicle was seen and with  further palpation underneath that nerve root, it was obvious then that there  was a mass which was located at that level.  With further dissection using a  hook, I was able to remove what was a free disk fragment.  Given the  location of the disk, I think it is more likely than not that it emanated  from the L2-3 disk space.  I did not inspect the L2-3 disk space, as the  myelogram showed no evidence of compression, and palpation with blunt hooks  showed that there was no compression of the nerve or loose disk material or  disk bulge there.  I then irrigated the wound.  I then closed the wound.  Dr. Channing Mutters assisted in removal of the disk.  I then removed the  retractor.  I irrigated and then closed the wound in layered fashion with  Dr. Temple Pacini assistance.  Skin incision reapproximated and a Dermabond used for  a sterile dressing.  The patient was then awakened after being rolled off  the Wilson frame and moving all extremities.                                               Coletta Memos, MD    KC/MEDQ  D:  01/09/2002  T:  01/10/2002  Job:  [id number]

## 2010-08-20 NOTE — Op Note (Signed)
[place]  Patient:    Robin Howe, Robin Howe Visit Number: [id number] MRN: [id number]          Service Type: DSU Location: [phone] Attending Physician:  Carson Myrtle Dictated by:   Sheppard Plumber Earlene Plater, M.D. Proc. Date: 05/25/01 Admit Date:  05/25/2001 Discharge Date: 05/26/2001                             Operative Report  PREOPERATIVE DIAGNOSIS:  Cholecystocholedocholithiasis.  POSTOPERATIVE DIAGNOSIS:  Cholecystocholedocholithiasis, probably passed common duct stone.  PROCEDURE:  Laparoscopic cholecystectomy and operative cholangiogram.  SURGEON:  Timothy E. Earlene Plater, M.D.  ASSISTANTS:  Vikki Ports, M.D., and Mr. Gilman Buttner.  ANESTHESIA:  C.R.N.A., supervised M.D., general endotracheal.  CLINICAL NOTE:  Robin Howe developed upper abdominal pain, primarily left, episodic.  Laboratory data done last week showed elevation of SGOT, SGPT, and amylase that is now resolved, and she is ready for urgently-scheduled surgery.  DESCRIPTION OF PROCEDURE:  The patient was evaluated this morning.  Her laboratory data are now normal.  EKG is normal.  She was taken to the operating room and placed supine and general endotracheal anesthesia administered.  The abdomen was scrubbed, prepped, and draped in the usual fashion.  Marcaine 0.25% with epinephrine was used prior to each incision.  A vertical infraumbilical incision made and fascia identified and opened vertically.  Peritoneum entered without complications.  The Hasson catheter placed, tied in place, and the abdomen insufflated.  A 10 mm trocar placed in the epigastrium, two 5 mm trocars in the right upper quadrant.  General peritoneoscopy was unremarkable except for the fatty omentum.  The gallbladder did contain stones, and there were a number of adhesions to the omentum. The gallbladder was grasped, placed on tension, and adhesions were taken down sharply with the cautery.  The  gallbladder was evaluated, and there were visible stones in the gallbladder near the ampulla.  The ampulla-cystic duct junction was carefully dissected out and clipped near the gallbladder.  A cholangiogram was performed by incising the cystic duct, inserting a Reddick catheter, and then using real-time fluoroscopy and injection of half-strength Hypaque.  The dye at first did not fill well and after the balloon was partially deflated, it flowed well into the common bile duct, the common duct, and the duodenum.  There was one questionable defect superior to the cystic duct takeoff.  With this information, the procedure was completed, the Reddick catheter removed, the stump of the cystic duct quadruply clipped, divided, and the cystic artery dissected out, quadruply clipped, and divided, and the gallbladder removed from the gallbladder bed.  Several small incisions were made in the gallbladder.  There was a small amount of bile leakage but no stones.  The gallbladder was placed in the Endocatch bag, and that was sealed. Copious irrigation was carried out.  Inspection of the gallbladder bed revealed no complications.  The gallbladder was then removed through the infraumbilical incision, which was then closed.  Careful inspection and re-irrigation was accomplished until clear, then all irrigation, CO2, instruments, and trocars removed.  There were no complications.  Counts correct, and she was removed to the recovery room in good condition. Dictated by:   Sheppard Plumber Earlene Plater, M.D. Attending Physician:  Carson Myrtle DD:  05/25/01 TD:  05/25/01 Job: [id number]

## 2010-08-20 NOTE — Op Note (Signed)
Robin Howe, Robin Howe               ACCOUNT NO.:  [id number]   MEDICAL RECORD NO.:  [id number]          PATIENT TYPE:  INP   LOCATION:  3005                         FACILITY:  [facility]   PHYSICIAN:  Coletta Memos, M.D.     DATE OF BIRTH:  1950-09-05   DATE OF PROCEDURE:  02/28/2005  DATE OF DISCHARGE:                                 OPERATIVE REPORT   PREOPERATIVE DIAGNOSES:  1.  Lumbar spondylolisthesis acquired, L4-5, L5-S1.  2.  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  3.  Lumbar radiculopathy.  4.  Lumbar stenosis.   POSTOPERATIVE DIAGNOSES:  1.  Lumbar spondylolisthesis acquired, L4-5, L5-S1.  2.  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  3.  Lumbar radiculopathy.  4.  Lumbar stenosis.   PROCEDURE:  1.  Decompression bilaterally of the L4, L5 and S1 nerve roots.  2.  Pedicle screw fixation, L4-L5 and S1:  3 screws on the right side, 2      screws on the left at L4 and S1.  3.  Posterolateral arthrodesis L4-S1, with morselized autograft and BNP.   SURGEON:  Coletta Memos, M.D.   ASSISTANT:  Clydene Fake, M.D.   COMPLICATIONS:  None.   FINDINGS:  Compression of the lumbar nerve roots of L4-L5 and of S1,  secondary to listhesis. Significant facet arthropathy at L4-5 and at L5-S1.   INDICATIONS:  Robin Howe is a 60 year old whom I took to the  operating room in 2003 for a diskectomy on the right side at L3-4. She  returned and had severe bilateral lower extremity pain whenever she stood or  walked for any period of time. An MRI showed profound facet arthropathy at  L4-5 and L5-S1; with enlarged facets, significant amount of fluid within the  joint spaces, and foraminal compromise. I therefore recommended, and she  agreed to undergo, operative decompression and subsequent arthrodesis with  pedicle screw segmental fixation.   OPERATIVE NOTE:  Robin Howe was brought to the operating room, intubated and  placed under a general anesthetic without  difficulty. She had a Foley  catheter placed under sterile conditions without problems. She was then  rolled onto flat rolls and positions to all pressure points were properly  padded. Her back was then prepped and she was draped in a sterile fashion. I  infiltrated  20 cc of 0.5% lidocaine with 1:200,000 strength epinephrine in  the lumbar region. I opened the skin with a #10 blade and took this down  through the superficial tissue to the thoracolumbar fascia. I then exposed  the lamina of S1, L5, L4 and the L3 on the left side. The lamina had been,  for the most part, removed at a previous operation.   I took an intraoperative x-ray to confirm our location. When that was done,  I then proceeded to perform decompressions of the L4-L5 and S1 nerve roots.  I started on the right side at L5-S1 and performed a semi-hemilaminectomy  (using a high-speed drill and Kerrison punch) of the L5 lamina. I had to do  a significant amount of facetectomy in order  to free the nerve root. The  ligament was quite redundant and the nerve root was obviously under some  pressure (this was the L5 root). After decompressing the L5 root by making  sure I had the pedicle within touching distance with a small hook, I then  decompressed the S1 nerve root. I then turned my attention to L4-5 and  performed a semi-hemilaminectomy of L4; again decompressed the L4 nerve root  of the medial facetectomy and removal of the ligament.  This was a medial  facetectomy of both the superior and inferior facet of L5 and L4. When that  was done, I then turned my attention to the left side and completed the same  procedure on the left, decompressing L4 and the L5 roots via semi-  hemilaminectomies of L4 and then L5. Facetectomy was done of the inferior  facet of L5 bilaterally, and also medial facetectomy of S1 bilaterally and  essentially two-third facetectomies of L4.   After thoroughly decompressing the nerve roots, I then  placed pedicle screws  using fluoroscopic guidance; 2 screws at L4. 2 screws at L5 and 2 screws in  S1. However, the screws on the left side did not appear that I would be able  to place a single rod from L4-S1. So, I removed the L5 screw, after  achieving very good purchase of L5 on the other side (the right side).   I then laid bone and BMP out over the decorticated transverse processes and  facets of L4-L5 and of S1. I did this in a bilateral fashion.   I then placed rods to connect screws in a segmental fashion. The locking  caps were placed and they were secured. X-ray showed screws to be in good  position, as were the rods.   I then closed in a layered fashion using Vicryl sutures to reapproximate the  thoracolumbar fascia. The patient tolerated the procedure without  difficulty, and moving all extremities postoperatively. Vicryl was used to  reapproximate the thoracolumbar subcutaneous and subcuticular layers.  Dermabond was used for a sterile dressing.           ______________________________  Coletta Memos, M.D.     KC/MEDQ  D:  02/28/2005  T:  03/01/2005  Job:  [id number]

## 2010-08-20 NOTE — Assessment & Plan Note (Signed)
[facility] OFFICE NOTE   Robin Howe, Robin Howe                      MRN:          [id number]  DATE:02/28/2006                            DOB:          05/08/1950    This is a 60 year old woman here for a complete physical examination. In  general she is doing well and has no issues to discuss with me. Of note,  since I saw her last year she had lumbar spine surgery in November 2006  per Dr. Franky Macho in which he fused 2 lumbar vertebrae. This has been very  successful in relieving her pain. She had a colonoscopy in September  2006 which showed a few benign polyps. A followup colonoscopy was  recommended in 3 years. Otherwise her depression is doing well as is her  eczema and her irritable bowel syndrome. She has been on Crestor for the  past year for elevated lipids and has adjusted her diet accordingly.   For further details of her past medical history, family history, social  history, habits, etc., I refer you to our last physical note for her  dated October 28, 2002.   ALLERGIES:  BEXTRA.   CURRENT MEDICATIONS:  Crestor 10 mg per day only (she took herself off  of Zoloft a few months ago).   OBJECTIVE:  VITAL SIGNS:  Height 4 foot 11 inches, weight 198, BP  140/84, pulse 88 and regular.  GENERAL:  She remains quite overweight.  SKIN:  Clear.  HEENT:  Eyes clear, pharynx clear.  NECK:  Supple without lymphadenopathy or masses.  LUNGS:  Clear.  CARDIAC:  Rate and rhythm regular without gallops, murmurs or rubs.  Distal pulses are full. EKG is within normal limits.  BREASTS/AXILLA:  Clear.  ABDOMEN:  Soft, normal bowel sounds, nontender, no masses.  PELVIC:  External genitalia within normal limits. Vagina is clear.  Cervix clear. Pap smear is obtained. Uterus not enlarged, no adnexa,  masses or tenderness.  RECTAL:  No masses or tenderness. Stool Hemoccult negative.  EXTREMITIES:  No clubbing, cyanosis or  edema.  NEUROLOGIC:  Grossly intact.   She was here for fasting labs on November 19. These were remarkable only  for her lipid panel, HDL was adequate at 50 but LDL remains elevated at  155.   ASSESSMENT/PLAN:  1. Complete physical examination. I encouraged her to get more      exercise and lose weight. She already has a mammogram scheduled for      February of next year.  2. Irritable bowel syndrome stable.  3. Depression stable off medications.  4. Eczema stable.  5. Hyperlipidemia. Will increase Crestor to 20 mg once a day and will      check a lipid panel again in 3 months.     Tera Mater. Clent Ridges, MD  Electronically Signed    SAF/MedQ  DD: 03/01/2006  DT: 03/01/2006  Job #: [id number]

## 2010-08-20 NOTE — Op Note (Signed)
[name], Robin Howe                         ACCOUNT NO.:  [id number]   MEDICAL RECORD NO.:  [id number]                   PATIENT TYPE:  AMB   LOCATION:  DSC                                  FACILITY:  [facility]   PHYSICIAN:  Earvin Hansen L. Shon Hough, M.D.           DATE OF BIRTH:  03/27/1951   DATE OF PROCEDURE:  02/03/2003  DATE OF DISCHARGE:                                 OPERATIVE REPORT   INDICATIONS FOR PROCEDURE:  This is a 60 year old lady with severe  macromastia, back and shoulder pain secondary to large pendulous breasts,  resistant to conservative treatment including analgesics, lotions, and  powders for intertriginous changes as well as increased back and shoulder  pain and neck pain.   PROCEDURE:  Bilateral breast reductions using the inferior pedicle  technique.   SURGEON:  Yaakov Guthrie. Shon Hough, M.D.   ASSISTANT:  Alethia Berthold, CFA, O-P.A.C.   DESCRIPTION OF PROCEDURE:  The patient was taken to the operating room and  placed on the operating table in the supine position.  Was given drawings  for the inferior pedicle reduction mammoplasty.  Remarking the nipple  areolar complexes back to 20 cm from the suprasternal notch.  She then  underwent general anesthesia intubated orally.  Prep was done to the chest,  breast areas, and neck, and abdomen using Betadine soap and solution, and  walled off with sterile towels and drapes so as to make a sterile field.  0.25% Xylocaine 1:400,000 concentration was injected locally a total of 150  mL per side.  After waiting a sufficient amount of time, the wounds were  then scored with #15 blades, after sterile towels and drapes had been  placed.  The skin over the inferior pedicle was deepithelialized with #20  blades.  Next, the medial and lateral fatty dermal pedicles were excised  down to underlying fascia after proper hemostasis.  The new keyhole area was  also debulked and then the flaps were then transposed and stayed with 3-0  Prolene suture.  Subcutaneous closure was done with 3-0 Monocryl x2 layers  and then running subcuticular stitch of 3-0 Monocryl and 5-0 Monocryl  throughout the inverted T.  The wounds were drained with #10 fully fluted  Blake drain which was placed in the depths of the wound and brought out  through the lateral most portion of the incision and secured with 3-0  Prolene. The wounds were cleansed.  Steri-Strips and soft dressing were  applied including Xeroforms, 4x4's, ABD's, and Hypafix tape.  She withstood  the procedures very well and was taken to the recovery room in excellent  condition.                                               Yaakov Guthrie. Shon Hough, M.D.  GLT/MEDQ  D:  02/03/2003  T:  02/03/2003  Job:  [id number]

## 2010-08-20 NOTE — Discharge Summary (Signed)
NAMEAUDRYANNA, Robin Howe               ACCOUNT NO.:  [id number]   MEDICAL RECORD NO.:  [id number]          PATIENT TYPE:  INP   LOCATION:  3005                         FACILITY:  [facility]   PHYSICIAN:  Coletta Memos, M.D.     DATE OF BIRTH:  12/26/1950   DATE OF ADMISSION:  02/28/2005  DATE OF DISCHARGE:  03/04/2005                                 DISCHARGE SUMMARY   ADMITTING DIAGNOSES:  1.  Lumbar stenosis L4-5, L5-S1.  2.  Lumbar radiculopathy.  3.  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  4.  Lumbar spondylosis.   DISCHARGE DIAGNOSES:  1.  Lumbar stenosis L4-5, L5-S1.  2.  Lumbar radiculopathy.  3.  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  4.  Lumbar spondylosis.   PROCEDURE:  Lumbar fusion L4-5, L5-S1, posterolateral with pedicle screw  fixation, lumbar decompression of the L4, L5, and S1 nerve roots.   COMPLICATIONS:  None.   DISCHARGE STATUS:  Alive and well.  Wound clean, dry without signs of  infection.   MEDICATIONS:  1.  Percocet 5/325 mg one p.o. q.6h.  2.  Flexeril 10 mg p.o. t.i.d. p.r.n. spasms.   INDICATIONS:  Robin Howe is a 60 year old who had profound spondylitic  change present in the lumbar spine.  She had severe back pain and lower  radicular pain.  I recommended and she agreed to undergo a decompression and  possible fusion.  She was admitted to the hospital and underwent operation  without difficulty.  Postoperatively she has done quite well.  She has been  up ambulating.  She has voided without difficulty.  Her wound is clean, dry,  and without signs of infection at discharge.  She will be discharged home  having had physical therapy and being cleared.  I will see her back in the  office in approximately four weeks.           ______________________________  Coletta Memos, M.D.     KC/MEDQ  D:  03/04/2005  T:  03/04/2005  Job:  [id number]

## 2010-12-30 ENCOUNTER — Emergency Department (HOSPITAL_COMMUNITY)
Admission: EM | Admit: 2010-12-30 | Discharge: 2010-12-31 | Disposition: A | Discharge status: Home / Self Care | Payer: BC Managed Care – PPO | Attending: Emergency Medicine | Admitting: Emergency Medicine

## 2010-12-30 DIAGNOSIS — R002 Palpitations: Secondary | ICD-10-CM | POA: Insufficient documentation

## 2010-12-30 DIAGNOSIS — K219 Gastro-esophageal reflux disease without esophagitis: Secondary | ICD-10-CM | POA: Insufficient documentation

## 2010-12-31 ENCOUNTER — Emergency Department (HOSPITAL_COMMUNITY): Payer: BC Managed Care – PPO

## 2010-12-31 LAB — CBC
HCT: 43.4 % (ref 36.0–46.0)
Hemoglobin: 15.2 g/dL — ABNORMAL HIGH (ref 12.0–15.0)
MCH: 31 pg (ref 26.0–34.0)
MCHC: 35 g/dL (ref 30.0–36.0)
MCV: 88.6 fL (ref 78.0–100.0)
Platelets: 244 10*3/uL (ref 150–400)
RBC: 4.9 MIL/uL (ref 3.87–5.11)
RDW: 12.5 % (ref 11.5–15.5)
WBC: 10.5 10*3/uL (ref 4.0–10.5)

## 2010-12-31 LAB — POCT I-STAT TROPONIN I: Troponin i, poc: 0 ng/mL (ref 0.00–0.08)

## 2010-12-31 LAB — URINALYSIS, ROUTINE W REFLEX MICROSCOPIC
Bilirubin Urine: NEGATIVE
Glucose, UA: NEGATIVE mg/dL
Hgb urine dipstick: NEGATIVE
Ketones, ur: NEGATIVE mg/dL
Nitrite: NEGATIVE
Protein, ur: NEGATIVE mg/dL
Specific Gravity, Urine: 1.013 (ref 1.005–1.030)
Urobilinogen, UA: 0.2 mg/dL (ref 0.0–1.0)
pH: 7 (ref 5.0–8.0)

## 2010-12-31 LAB — BASIC METABOLIC PANEL
BUN: 16 mg/dL (ref 6–23)
CO2: 28 mEq/L (ref 19–32)
Calcium: 10 mg/dL (ref 8.4–10.5)
Chloride: 103 mEq/L (ref 96–112)
Creatinine, Ser: 0.65 mg/dL (ref 0.50–1.10)
GFR calc Af Amer: >60 mL/min (ref >60)
GFR calc non Af Amer: >60 mL/min (ref >60)
Glucose, Bld: 105 mg/dL — ABNORMAL HIGH (ref 70–99)
Potassium: 3.4 mEq/L — ABNORMAL LOW (ref 3.5–5.1)
Sodium: 142 mEq/L (ref 135–145)

## 2010-12-31 LAB — DIFFERENTIAL
Basophils Absolute: 0.1 10*3/uL (ref 0.0–0.1)
Basophils Relative: 1 % (ref 0–1)
Eosinophils Absolute: 0.4 10*3/uL (ref 0.0–0.7)
Eosinophils Relative: 4 % (ref 0–5)
Lymphocytes Relative: 22 % (ref 12–46)
Lymphs Abs: 2.3 10*3/uL (ref 0.7–4.0)
Monocytes Absolute: 0.7 10*3/uL (ref 0.1–1.0)
Monocytes Relative: 7 % (ref 3–12)
Neutro Abs: 7 10*3/uL (ref 1.7–7.7)
Neutrophils Relative %: 67 % (ref 43–77)

## 2010-12-31 LAB — URINE MICROSCOPIC-ADD ON

## 2011-01-10 ENCOUNTER — Ambulatory Visit (INDEPENDENT_AMBULATORY_CARE_PROVIDER_SITE_OTHER): Payer: BC Managed Care – PPO | Admitting: Family Medicine

## 2011-01-10 ENCOUNTER — Encounter: Payer: Self-pay | Admitting: Family Medicine

## 2011-01-10 VITALS — BP 114/74 | HR 73 | Temp 98.9°F | Wt 183.0 lb

## 2011-01-10 DIAGNOSIS — R002 Palpitations: Secondary | ICD-10-CM

## 2011-01-10 DIAGNOSIS — N390 Urinary tract infection, site not specified: Secondary | ICD-10-CM

## 2011-01-10 LAB — POCT URINALYSIS DIPSTICK
Bilirubin, UA: NEGATIVE
Blood, UA: NEGATIVE
Glucose, UA: NEGATIVE
Nitrite, UA: NEGATIVE
Urobilinogen, UA: 0.2

## 2011-01-10 MED ORDER — CIPROFLOXACIN HCL 500 MG PO TABS: 500.0000 mg | ORAL_TABLET | Freq: Two times a day (BID) | ORAL | Status: AC

## 2011-01-10 NOTE — Progress Notes (Signed)
  Subjective:    Patient ID: Robin Howe, female    DOB: March 03, 1951, 60 y.o.   MRN: [id number]  HPI Here to follow up an ER visit on 12-30-10 when she woke up from a nap on the couch and felt palpitations for about one hour. She had no SOB or chest pain. At the ER her work up was normal except for an abnormal UA that they did not address. She has had some increased urgency to urinate lately. No fever or burning. She has had no palpitations since that night.    Review of Systems  Constitutional: Negative.   Respiratory: Negative.   Cardiovascular: Positive for palpitations. Negative for chest pain and leg swelling.  Genitourinary: Positive for urgency and frequency. Negative for dysuria, hematuria and flank pain.       Objective:   Physical Exam  Constitutional: She appears well-developed and well-nourished.  Neck: No thyromegaly present.  Cardiovascular: Normal rate, regular rhythm, normal heart sounds and intact distal pulses.  Exam reveals no gallop and no friction rub.   No murmur heard. Pulmonary/Chest: Effort normal and breath sounds normal. No respiratory distress. She has no wheezes. She has no rales. She exhibits no tenderness.  Abdominal: Soft. Bowel sounds are normal. She exhibits no distension and no mass. There is no tenderness. There is no rebound and no guarding.  Lymphadenopathy:    She has no cervical adenopathy.          Assessment & Plan:

## 2011-01-10 NOTE — Progress Notes (Signed)
Addended by: Aniceto Boss A on: 01/10/2011 10:13 AM   Modules accepted: Orders

## 2011-03-10 ENCOUNTER — Other Ambulatory Visit: Payer: Self-pay | Admitting: Family Medicine

## 2011-03-11 ENCOUNTER — Telehealth: Payer: Self-pay | Admitting: Family Medicine

## 2011-03-11 MED ORDER — ROSUVASTATIN CALCIUM 10 MG PO TABS: 10.0000 mg | ORAL_TABLET | Freq: Every day | ORAL | Status: DC

## 2011-03-11 NOTE — Telephone Encounter (Signed)
op

## 2011-03-11 NOTE — Telephone Encounter (Signed)
Script sent e-scribe 

## 2011-03-17 ENCOUNTER — Other Ambulatory Visit (INDEPENDENT_AMBULATORY_CARE_PROVIDER_SITE_OTHER): Payer: BC Managed Care – PPO | Admitting: Family Medicine

## 2011-03-17 DIAGNOSIS — Z0000 Encounter for general adult medical examination without abnormal findings: Secondary | ICD-10-CM

## 2011-03-17 DIAGNOSIS — Z23 Encounter for immunization: Secondary | ICD-10-CM

## 2011-03-17 LAB — LIPID PANEL
Cholesterol: 148 mg/dL (ref 0–200)
LDL Cholesterol: 79 mg/dL (ref 0–99)
VLDL: 13.4 mg/dL (ref 0.0–40.0)

## 2011-03-17 LAB — BASIC METABOLIC PANEL
BUN: 17 mg/dL (ref 6–23)
Chloride: 105 mEq/L (ref 96–112)
GFR: 82.51 mL/min (ref >60.00)
Potassium: 4.4 mEq/L (ref 3.5–5.1)
Sodium: 141 mEq/L (ref 135–145)

## 2011-03-17 LAB — CBC WITH DIFFERENTIAL/PLATELET
Basophils Relative: 0.7 % (ref 0.0–3.0)
Eosinophils Relative: 5 % (ref 0.0–5.0)
HCT: 40.8 % (ref 36.0–46.0)
Hemoglobin: 14.2 g/dL (ref 12.0–15.0)
Lymphs Abs: 1.8 10*3/uL (ref 0.7–4.0)
MCV: 92 fl (ref 78.0–100.0)
Monocytes Absolute: 0.4 10*3/uL (ref 0.1–1.0)
Monocytes Relative: 6.5 % (ref 3.0–12.0)
Platelets: 247 10*3/uL (ref 150.0–400.0)
RBC: 4.43 Mil/uL (ref 3.87–5.11)
WBC: 6.4 10*3/uL (ref 4.5–10.5)

## 2011-03-17 LAB — POCT URINALYSIS DIPSTICK
Glucose, UA: NEGATIVE
Ketones, UA: NEGATIVE
Leukocytes, UA: NEGATIVE
Urobilinogen, UA: 0.2

## 2011-03-17 LAB — HEPATIC FUNCTION PANEL
ALT: 20 U/L (ref 0–35)
AST: 33 U/L (ref 0–37)
Total Bilirubin: 0.6 mg/dL (ref 0.3–1.2)
Total Protein: 7.7 g/dL (ref 6.0–8.3)

## 2011-03-17 LAB — TSH: TSH: 2.78 u[IU]/mL (ref 0.35–5.50)

## 2011-03-23 NOTE — Progress Notes (Signed)
Quick Note:  Spoke with pt ______ 

## 2011-03-24 ENCOUNTER — Encounter: Payer: Self-pay | Admitting: Family Medicine

## 2011-03-24 ENCOUNTER — Ambulatory Visit (INDEPENDENT_AMBULATORY_CARE_PROVIDER_SITE_OTHER): Payer: BC Managed Care – PPO | Admitting: Family Medicine

## 2011-03-24 VITALS — BP 118/76 | HR 75 | Temp 98.6°F | Ht <= 58 in | Wt 183.0 lb

## 2011-03-24 DIAGNOSIS — Z0000 Encounter for general adult medical examination without abnormal findings: Secondary | ICD-10-CM

## 2011-03-24 NOTE — Progress Notes (Signed)
  Subjective:    Patient ID: Robin Howe, female    DOB: 1950-10-03, 60 y.o.   MRN: [id number]  HPI 60 yr old female for a cpx. She feels fine and has no concerns.    Review of Systems  Constitutional: Negative.   HENT: Negative.   Eyes: Negative.   Respiratory: Negative.   Cardiovascular: Negative.   Gastrointestinal: Negative.   Genitourinary: Negative for dysuria, urgency, frequency, hematuria, flank pain, decreased urine volume, enuresis, difficulty urinating, pelvic pain and dyspareunia.  Musculoskeletal: Negative.   Skin: Negative.   Neurological: Negative.   Hematological: Negative.   Psychiatric/Behavioral: Negative.        Objective:   Physical Exam  Constitutional: She is oriented to person, place, and time. She appears well-developed and well-nourished. No distress.  HENT:  Head: Normocephalic and atraumatic.  Right Ear: External ear normal.  Left Ear: External ear normal.  Nose: Nose normal.  Mouth/Throat: Oropharynx is clear and moist. No oropharyngeal exudate.  Eyes: Conjunctivae and EOM are normal. Pupils are equal, round, and reactive to light. No scleral icterus.  Neck: Normal range of motion. Neck supple. No JVD present. No thyromegaly present.  Cardiovascular: Normal rate, regular rhythm, normal heart sounds and intact distal pulses.  Exam reveals no gallop and no friction rub.   No murmur heard. Pulmonary/Chest: Effort normal and breath sounds normal. No respiratory distress. She has no wheezes. She has no rales. She exhibits no tenderness.  Abdominal: Soft. Bowel sounds are normal. She exhibits no distension and no mass. There is no tenderness. There is no rebound and no guarding.  Genitourinary: No breast swelling, tenderness, discharge or bleeding.  Musculoskeletal: Normal range of motion. She exhibits no edema and no tenderness.  Lymphadenopathy:    She has no cervical adenopathy.  Neurological: She is alert and oriented to person, place, and  time. She has normal reflexes. No cranial nerve deficit. She exhibits normal muscle tone. Coordination normal.  Skin: Skin is warm and dry. No rash noted. No erythema.  Psychiatric: She has a normal mood and affect. Her behavior is normal. Judgment and thought content normal.          Assessment & Plan:  Well exam. Get more exercise and lose some weight.

## 2011-05-20 ENCOUNTER — Telehealth: Payer: Self-pay | Admitting: Family Medicine

## 2011-05-20 DIAGNOSIS — R918 Other nonspecific abnormal finding of lung field: Secondary | ICD-10-CM

## 2011-05-20 NOTE — Telephone Encounter (Signed)
I have ordered the chest CT, so Camelia Eng will call her next week about the appt

## 2011-05-20 NOTE — Telephone Encounter (Signed)
Left voice message.

## 2011-05-20 NOTE — Telephone Encounter (Signed)
Patient called stating that per MD she was to call the office to schedule a CT. Patient needs a referral for a CT. Please advise.

## 2011-07-08 ENCOUNTER — Ambulatory Visit (INDEPENDENT_AMBULATORY_CARE_PROVIDER_SITE_OTHER)
Admission: RE | Admit: 2011-07-08 | Discharge: 2011-07-08 | Disposition: A | Discharge status: Home / Self Care | Payer: BC Managed Care – PPO | Source: Ambulatory Visit | Attending: Family Medicine | Admitting: Family Medicine

## 2011-07-08 DIAGNOSIS — R918 Other nonspecific abnormal finding of lung field: Secondary | ICD-10-CM

## 2011-08-04 ENCOUNTER — Encounter: Payer: Self-pay | Admitting: Family Medicine

## 2011-09-23 ENCOUNTER — Encounter: Payer: Self-pay | Admitting: Family Medicine

## 2011-09-23 ENCOUNTER — Ambulatory Visit (INDEPENDENT_AMBULATORY_CARE_PROVIDER_SITE_OTHER): Payer: BC Managed Care – PPO | Admitting: Family Medicine

## 2011-09-23 VITALS — BP 118/70 | HR 74 | Temp 98.7°F | Wt 190.0 lb

## 2011-09-23 DIAGNOSIS — G4700 Insomnia, unspecified: Secondary | ICD-10-CM

## 2011-09-23 DIAGNOSIS — E785 Hyperlipidemia, unspecified: Secondary | ICD-10-CM

## 2011-09-23 LAB — HEPATIC FUNCTION PANEL
ALT: 20 U/L (ref 0–35)
Albumin: 4.3 g/dL (ref 3.5–5.2)
Alkaline Phosphatase: 71 U/L (ref 39–117)
Bilirubin, Direct: 0 mg/dL (ref 0.0–0.3)
Total Protein: 7.5 g/dL (ref 6.0–8.3)

## 2011-09-23 LAB — LIPID PANEL
Cholesterol: 159 mg/dL (ref 0–200)
Triglycerides: 107 mg/dL (ref 0.0–149.0)

## 2011-09-23 NOTE — Progress Notes (Signed)
  Subjective:    Patient ID: Robin Howe, female    DOB: 05/09/1950, 61 y.o.   MRN: [id number]  HPI Here to recheck her lipids. She feels well and is following a strict diet. She does asks about the safety of Tylenol PM. She takes one of these every night for sleep, and they work well.    Review of Systems  Constitutional: Negative.   Respiratory: Negative.   Cardiovascular: Negative.        Objective:   Physical Exam  Constitutional: She appears well-developed and well-nourished.  Cardiovascular: Normal rate, regular rhythm, normal heart sounds and intact distal pulses.   Pulmonary/Chest: Effort normal and breath sounds normal.          Assessment & Plan:  Get fasting labs. I reassured that she may use Tylenol PM whenever she wants.

## 2011-09-26 NOTE — Progress Notes (Signed)
Quick Note:  I spoke with pt ______ 

## 2012-01-26 ENCOUNTER — Ambulatory Visit (INDEPENDENT_AMBULATORY_CARE_PROVIDER_SITE_OTHER): Payer: BC Managed Care – PPO

## 2012-01-26 DIAGNOSIS — Z23 Encounter for immunization: Secondary | ICD-10-CM

## 2012-03-05 ENCOUNTER — Other Ambulatory Visit: Payer: Self-pay | Admitting: Family Medicine

## 2012-04-24 ENCOUNTER — Other Ambulatory Visit (INDEPENDENT_AMBULATORY_CARE_PROVIDER_SITE_OTHER): Payer: BC Managed Care – PPO

## 2012-04-24 DIAGNOSIS — Z0000 Encounter for general adult medical examination without abnormal findings: Secondary | ICD-10-CM

## 2012-04-24 LAB — HEPATIC FUNCTION PANEL
ALT: 22 U/L (ref 0–35)
Alkaline Phosphatase: 71 U/L (ref 39–117)
Bilirubin, Direct: 0.1 mg/dL (ref 0.0–0.3)
Total Bilirubin: 0.8 mg/dL (ref 0.3–1.2)

## 2012-04-24 LAB — CBC WITH DIFFERENTIAL/PLATELET
Eosinophils Relative: 5.9 % — ABNORMAL HIGH (ref 0.0–5.0)
Lymphocytes Relative: 23 % (ref 12.0–46.0)
MCV: 90.9 fl (ref 78.0–100.0)
Monocytes Absolute: 0.4 10*3/uL (ref 0.1–1.0)
Monocytes Relative: 5.8 % (ref 3.0–12.0)
Neutrophils Relative %: 64.3 % (ref 43.0–77.0)
Platelets: 221 10*3/uL (ref 150.0–400.0)
WBC: 6.5 10*3/uL (ref 4.5–10.5)

## 2012-04-24 LAB — BASIC METABOLIC PANEL
BUN: 17 mg/dL (ref 6–23)
Calcium: 9.7 mg/dL (ref 8.4–10.5)
Creatinine, Ser: 0.7 mg/dL (ref 0.4–1.2)
GFR: 84.77 mL/min (ref >60.00)

## 2012-04-24 LAB — LIPID PANEL
Cholesterol: 147 mg/dL (ref 0–200)
LDL Cholesterol: 80 mg/dL (ref 0–99)
VLDL: 19.8 mg/dL (ref 0.0–40.0)

## 2012-04-27 NOTE — Progress Notes (Signed)
Quick Note:  I spoke with pt ______ 

## 2012-05-01 ENCOUNTER — Encounter: Payer: Self-pay | Admitting: Family Medicine

## 2012-05-01 ENCOUNTER — Ambulatory Visit (INDEPENDENT_AMBULATORY_CARE_PROVIDER_SITE_OTHER): Payer: BC Managed Care – PPO | Admitting: Family Medicine

## 2012-05-01 VITALS — BP 158/90 | HR 80 | Temp 98.2°F | Ht 58.5 in | Wt 170.0 lb

## 2012-05-01 DIAGNOSIS — Z0000 Encounter for general adult medical examination without abnormal findings: Secondary | ICD-10-CM

## 2012-05-01 MED ORDER — ROSUVASTATIN CALCIUM 10 MG PO TABS: 10.0000 mg | ORAL_TABLET | Freq: Every day | ORAL | Status: DC

## 2012-05-01 NOTE — Progress Notes (Signed)
  Subjective:    Patient ID: Robin Howe, female    DOB: June 15, 1950, 62 y.o.   MRN: [id number]  HPI 62 yr old female for a cpx. She feels well. She is going to [employer] and she has lost 20 lbs in the past 6 months.    Review of Systems  Constitutional: Negative.   HENT: Negative.   Eyes: Negative.   Respiratory: Negative.   Cardiovascular: Negative.   Gastrointestinal: Negative.   Genitourinary: Negative for dysuria, urgency, frequency, hematuria, flank pain, decreased urine volume, enuresis, difficulty urinating, pelvic pain and dyspareunia.  Musculoskeletal: Negative.   Skin: Negative.   Neurological: Negative.   Hematological: Negative.   Psychiatric/Behavioral: Negative.        Objective:   Physical Exam  Constitutional: She is oriented to person, place, and time. She appears well-developed and well-nourished. No distress.  HENT:  Head: Normocephalic and atraumatic.  Right Ear: External ear normal.  Left Ear: External ear normal.  Nose: Nose normal.  Mouth/Throat: Oropharynx is clear and moist. No oropharyngeal exudate.  Eyes: Conjunctivae normal and EOM are normal. Pupils are equal, round, and reactive to light. No scleral icterus.  Neck: Normal range of motion. Neck supple. No JVD present. No thyromegaly present.  Cardiovascular: Normal rate, regular rhythm, normal heart sounds and intact distal pulses.  Exam reveals no gallop and no friction rub.   No murmur heard.      EKG normal   Pulmonary/Chest: Effort normal and breath sounds normal. No respiratory distress. She has no wheezes. She has no rales. She exhibits no tenderness.  Abdominal: Soft. Bowel sounds are normal. She exhibits no distension and no mass. There is no tenderness. There is no rebound and no guarding.  Musculoskeletal: Normal range of motion. She exhibits no edema and no tenderness.  Lymphadenopathy:    She has no cervical adenopathy.  Neurological: She is alert and oriented to person,  place, and time. She has normal reflexes. No cranial nerve deficit. She exhibits normal muscle tone. Coordination normal.  Skin: Skin is warm and dry. No rash noted. No erythema.  Psychiatric: She has a normal mood and affect. Her behavior is normal. Judgment and thought content normal.          Assessment & Plan:  Well exam. Recheck in 6 months

## 2012-05-23 ENCOUNTER — Ambulatory Visit (INDEPENDENT_AMBULATORY_CARE_PROVIDER_SITE_OTHER): Payer: BC Managed Care – PPO | Admitting: Family Medicine

## 2012-05-23 ENCOUNTER — Encounter: Payer: Self-pay | Admitting: Family Medicine

## 2012-05-23 VITALS — BP 144/78 | HR 82 | Temp 98.5°F | Wt 168.0 lb

## 2012-05-23 DIAGNOSIS — M1990 Unspecified osteoarthritis, unspecified site: Secondary | ICD-10-CM

## 2012-05-23 NOTE — Progress Notes (Signed)
  Subjective:    Patient ID: Robin Howe, female    DOB: [birth year], 62 y.o.   MRN: [id number]  HPI Here for one month of worsening pain in the right knee. No trauma hx. She takes nothing for this. No locking or giving way. No redness or warmth. It does swell at times.   Review of Systems  Constitutional: Negative.   Musculoskeletal: Positive for arthralgias.       Objective:   Physical Exam  Constitutional: She appears well-developed and well-nourished. No distress.  Musculoskeletal:  Right knee has a lot of crepitus. ROM is full although full flexion is painful for her. Quite tender along the medial joint space. No swelling. McMurrays and anterior drawer are negative.           Assessment & Plan:  Osteoarthritis. Wear a neoprene support sleeve. Try Aleve bid. Recheck prn

## 2012-08-11 ENCOUNTER — Encounter: Payer: Self-pay | Admitting: Family Medicine

## 2012-09-25 ENCOUNTER — Telehealth: Payer: Self-pay | Admitting: Family Medicine

## 2012-09-25 MED ORDER — TRIAMCINOLONE ACETONIDE 0.1 % EX CREA: TOPICAL_CREAM | Freq: Three times a day (TID) | CUTANEOUS | Status: DC | PRN

## 2012-09-25 NOTE — Telephone Encounter (Signed)
I sent script e-scribe and spoke with pt. 

## 2012-09-25 NOTE — Telephone Encounter (Signed)
Patient Information:  Caller Name: Cheris  Phone: [phone]  Patient: Robin Howe, Robin Howe  Gender: Female  DOB: 30-Mar-1951  Age: 62 Years  PCP: Gershon Crane [facility])  Office Follow Up:  Does the office need to follow up with this patient?: Yes  Instructions For The Office: Pt requests Rx for something stronger for itch   Symptoms  Reason For Call & Symptoms: Onset 09/18/12 Poison ivy on right foot big toe size of dime, small water blisters, very itchy especially with heat and pt is outside in yard a lot.   Using Aveeno with Calamyne lotion without relief.  Afebrile.    Requesting if prescription can be called in due to itching.   Care advice given per protocol with callback perimeteres  Reviewed Health History In EMR: Yes  Reviewed Medications In EMR: Yes  Reviewed Allergies In EMR: Yes  Reviewed Surgeries / Procedures: Yes  Date of Onset of Symptoms: 09/18/2012  Treatments Tried: Aveeno with Calamyne lotion  Treatments Tried Worked: No  Guideline(s) Used:  Poison Ivy - Oak or [facility]  Disposition Per Guideline:   Home Care  Reason For Disposition Reached:   Poison [place], [place], or [place] with no complications  Advice Given:  Hydrocortisone Cream for Itching:   Apply 1% hydrocortisone cream 4 times a day to reduce itching. Use it for 5 days.  Keep the cream in the refrigerator (Reason: it feels better if applied cold).  Available over-the-counter in [place] as 0.5% and 1% cream.  Apply Cold to the Area:  Soak the involved area in cool water for 20 minutes or massage it with an ice cube as often as necessary to reduce itching and oozing.  Oral Antihistamine Medication for Itching:   An over-the-counter antihistamine that causes less sleepiness is loratadine (e.g., Alavert or Claritin).  Avoid Scratching:   Cut your fingernails short and try not to scratch so as to prevent a secondary infection from bacteria.  New Blisters Appear:  If new blisters occur several  days after the first ones, you probably have had ongoing contact with the irritating plant oil. To prevent recurrences: bathe all dogs and wash all clothes and shoes that were with you on the day of exposure.  Contagiousness:  Poison ivy or oak is not contagious to others.  Expected Course:  Usually lasts 2 weeks. Treatment reduces the severity of the symptoms, not how long they last.  Call Back If:  Rash lasts longer than 3 weeks  It looks infected  You become worse.  RN Overrode Recommendation:  Patient Requests Prescription  Pt requests Rx for something stronger for itch.

## 2012-09-25 NOTE — Telephone Encounter (Signed)
Call in Triamcinolone 0.1% cream to apply tid prn rash, 30 grams with 2 rf

## 2012-12-09 IMAGING — CR DG CHEST 1V PORT
1 series · 1 of 1 positions shown · non-contrast
Comparison: CT of the chest performed 06/29/2010

CLINICAL DATA: New onset of palpitations.

PORTABLE CHEST - 1 VIEW

[AP]
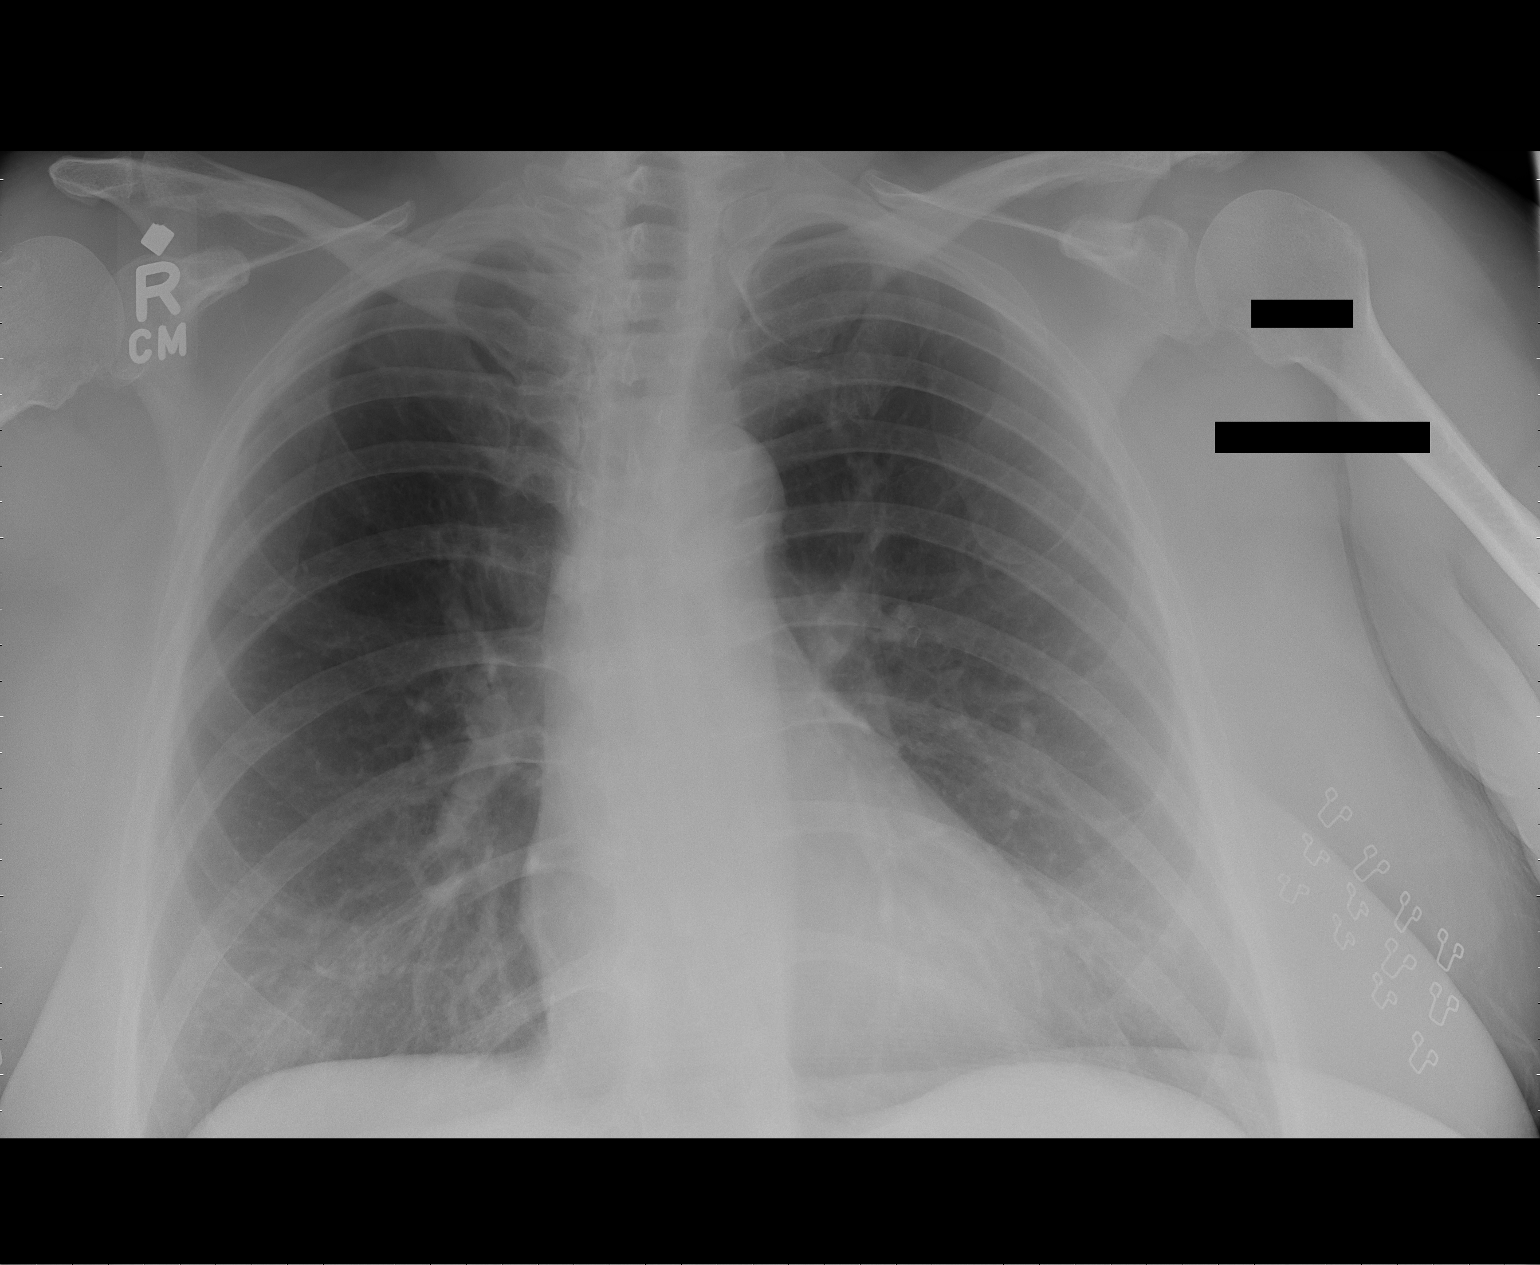

[1 of 1 positions shown; findings below may reference images not displayed]

FINDINGS: The lungs are well-aerated.  There is no evidence of
focal opacification, pleural effusion or pneumothorax.  The
patient's known 6 mm right-sided pulmonary nodule is not
characterized on radiograph.

The cardiomediastinal silhouette is within normal limits.  No acute
osseous abnormalities are seen.
IMPRESSION: No acute cardiopulmonary process seen.

## 2013-01-25 ENCOUNTER — Ambulatory Visit (INDEPENDENT_AMBULATORY_CARE_PROVIDER_SITE_OTHER): Payer: BC Managed Care – PPO

## 2013-01-25 DIAGNOSIS — Z23 Encounter for immunization: Secondary | ICD-10-CM

## 2013-04-02 ENCOUNTER — Ambulatory Visit (INDEPENDENT_AMBULATORY_CARE_PROVIDER_SITE_OTHER): Payer: BC Managed Care – PPO | Admitting: Family

## 2013-04-02 ENCOUNTER — Encounter: Payer: Self-pay | Admitting: Family

## 2013-04-02 VITALS — BP 156/90 | HR 71 | Wt 166.0 lb

## 2013-04-02 DIAGNOSIS — R3915 Urgency of urination: Secondary | ICD-10-CM

## 2013-04-02 DIAGNOSIS — R350 Frequency of micturition: Secondary | ICD-10-CM

## 2013-04-02 LAB — POCT URINALYSIS DIPSTICK
Blood, UA: NEGATIVE
Protein, UA: NEGATIVE
Spec Grav, UA: 1.015
Urobilinogen, UA: NEGATIVE

## 2013-04-02 MED ORDER — NITROFURANTOIN MONOHYD MACRO 100 MG PO CAPS: 100.0000 mg | ORAL_CAPSULE | Freq: Two times a day (BID) | ORAL | Status: DC

## 2013-04-02 NOTE — Patient Instructions (Signed)
Urinary Tract Infection  Urinary tract infections (UTIs) can develop anywhere along your urinary tract. Your urinary tract is your body's drainage system for removing wastes and extra water. Your urinary tract includes two kidneys, two ureters, a bladder, and a urethra. Your kidneys are a pair of bean-shaped organs. Each kidney is about the size of your fist. They are located below your ribs, one on each side of your spine.  CAUSES  Infections are caused by microbes, which are microscopic organisms, including fungi, viruses, and bacteria. These organisms are so small that they can only be seen through a microscope. Bacteria are the microbes that most commonly cause UTIs.  SYMPTOMS   Symptoms of UTIs may vary by age and gender of the patient and by the location of the infection. Symptoms in young women typically include a frequent and intense urge to urinate and a painful, burning feeling in the bladder or urethra during urination. Older women and men are more likely to be tired, shaky, and weak and have muscle aches and abdominal pain. A fever may mean the infection is in your kidneys. Other symptoms of a kidney infection include pain in your back or sides below the ribs, nausea, and vomiting.  DIAGNOSIS  To diagnose a UTI, your caregiver will ask you about your symptoms. Your caregiver also will ask to provide a urine sample. The urine sample will be tested for bacteria and white blood cells. White blood cells are made by your body to help fight infection.  TREATMENT   Typically, UTIs can be treated with medication. Because most UTIs are caused by a bacterial infection, they usually can be treated with the use of antibiotics. The choice of antibiotic and length of treatment depend on your symptoms and the type of bacteria causing your infection.  HOME CARE INSTRUCTIONS   If you were prescribed antibiotics, take them exactly as your caregiver instructs you. Finish the medication even if you feel better after you  have only taken some of the medication.   Drink enough water and fluids to keep your urine clear or pale yellow.   Avoid caffeine, tea, and carbonated beverages. They tend to irritate your bladder.   Empty your bladder often. Avoid holding urine for long periods of time.   Empty your bladder before and after sexual intercourse.   After a bowel movement, women should cleanse from front to back. Use each tissue only once.  SEEK MEDICAL CARE IF:    You have back pain.   You develop a fever.   Your symptoms do not begin to resolve within 3 days.  SEEK IMMEDIATE MEDICAL CARE IF:    You have severe back pain or lower abdominal pain.   You develop chills.   You have nausea or vomiting.   You have continued burning or discomfort with urination.  MAKE SURE YOU:    Understand these instructions.   Will watch your condition.   Will get help right away if you are not doing well or get worse.  Document Released: 12/29/2004 Document Revised: 09/20/2011 Document Reviewed: 04/29/2011  ExitCare Patient Information 2014 [employer].

## 2013-04-02 NOTE — Progress Notes (Signed)
Subjective:    Patient ID: Robin Howe, female    DOB: 07-19-1950, 62 y.o.   MRN: [id number]  HPI 62 year old white female, nonsmoker, patient of Dr. Clent Ridges in with complaints of urinary frequency and urgency x4 days. Reports is better now. Overall feels PT. Has a history of urinary tract infections and renal calculi in the past. Grew concerned because urinary tract infections at present possibly in her in the past.   Review of Systems  Constitutional: Negative.   Respiratory: Negative.   Cardiovascular: Negative.   Gastrointestinal: Negative.  Negative for abdominal pain.  Genitourinary: Positive for urgency and frequency. Negative for dysuria.  Musculoskeletal: Negative.   Skin: Negative.   Neurological: Negative.   Psychiatric/Behavioral: Negative.    Past Medical History  Diagnosis Date  . Hyperlipidemia   . Multiple lung nodules     both lung bases, appear benign, next scheduled CT will be march 2013  . Kidney stones     sees Dr. Larey Dresser   . Depression   . Irritable bowel syndrome (IBS)   . GERD (gastroesophageal reflux disease)   . Arthritis   . Colon polyps     History   Social History  . Marital Status: Widowed    Spouse Name: N/A    Number of Children: N/A  . Years of Education: N/A   Occupational History  . Not on file.   Social History Main Topics  . Smoking status: Never Smoker   . Smokeless tobacco: Never Used  . Alcohol Use: 0.0 oz/week     Comment: rare  . Drug Use: No  . Sexual Activity: Not on file   Other Topics Concern  . Not on file   Social History Narrative  . No narrative on file    Past Surgical History  Procedure Laterality Date  . Carpal tunnel release      bilateral, per Dr. Mikal Plane  . Colonoscopy  02-04-10    per Dr. Russella Dar, clear, repeat in 5 yrs   . Lumbar laminectomy    . Lumbar fusion      per Dr. Franky Macho  . Breast reduction surgery      bilateral   . Cholecystectomy    . Cesarean section      No family  history on file.  Allergies  Allergen Reactions  . Bextra [Valdecoxib]     rash    Current Outpatient Prescriptions on File Prior to Visit  Medication Sig Dispense Refill  . rosuvastatin (CRESTOR) 10 MG tablet Take 1 tablet (10 mg total) by mouth daily.  30 tablet  11  . triamcinolone cream (KENALOG) 0.1 % Apply topically 3 (three) times daily as needed.  30 g  2   No current facility-administered medications on file prior to visit.    BP 156/90  Pulse 71  Wt 166 lb (75.297 kg)chart     Objective:   Physical Exam  Constitutional: She is oriented to person, place, and time. She appears well-developed and well-nourished.  Cardiovascular: Normal rate, regular rhythm and normal heart sounds.   Pulmonary/Chest: Effort normal and breath sounds normal.  Abdominal: Soft. Bowel sounds are normal. There is no tenderness. There is no rebound.  Musculoskeletal: Normal range of motion.  Neurological: She is alert and oriented to person, place, and time.  Skin: Skin is warm and dry.  Psychiatric: She has a normal mood and affect.          Assessment & Plan:  Assessment:  1.  Urinary frequency 2. Urinary urgency  Plan: Urine culture sent. When the patient results. Given the holiday, for Macrobid to be used if worsen. However await culture of urine.

## 2013-04-04 LAB — URINE CULTURE
Colony Count: NO GROWTH
Organism ID, Bacteria: NO GROWTH

## 2013-04-24 ENCOUNTER — Other Ambulatory Visit (INDEPENDENT_AMBULATORY_CARE_PROVIDER_SITE_OTHER): Payer: BC Managed Care – PPO

## 2013-04-24 DIAGNOSIS — Z0000 Encounter for general adult medical examination without abnormal findings: Secondary | ICD-10-CM

## 2013-04-24 LAB — CBC WITH DIFFERENTIAL/PLATELET
BASOS ABS: 0 10*3/uL (ref 0.0–0.1)
BASOS PCT: 0.8 % (ref 0.0–3.0)
EOS PCT: 7.8 % — AB (ref 0.0–5.0)
Eosinophils Absolute: 0.5 10*3/uL (ref 0.0–0.7)
HEMATOCRIT: 41.2 % (ref 36.0–46.0)
HEMOGLOBIN: 14.3 g/dL (ref 12.0–15.0)
LYMPHS ABS: 2 10*3/uL (ref 0.7–4.0)
LYMPHS PCT: 32.2 % (ref 12.0–46.0)
MCHC: 34.7 g/dL (ref 30.0–36.0)
MCV: 90.5 fl (ref 78.0–100.0)
MONOS PCT: 6 % (ref 3.0–12.0)
Monocytes Absolute: 0.4 10*3/uL (ref 0.1–1.0)
NEUTROS ABS: 3.3 10*3/uL (ref 1.4–7.7)
Neutrophils Relative %: 53.2 % (ref 43.0–77.0)
Platelets: 221 10*3/uL (ref 150.0–400.0)
RBC: 4.55 Mil/uL (ref 3.87–5.11)
RDW: 12.4 % (ref 11.5–14.6)
WBC: 6.2 10*3/uL (ref 4.5–10.5)

## 2013-04-24 LAB — LIPID PANEL
CHOL/HDL RATIO: 3
Cholesterol: 137 mg/dL (ref 0–200)
HDL: 51.7 mg/dL (ref >39.00)
LDL CALC: 73 mg/dL (ref 0–99)
TRIGLYCERIDES: 62 mg/dL (ref 0.0–149.0)
VLDL: 12.4 mg/dL (ref 0.0–40.0)

## 2013-04-24 LAB — POCT URINALYSIS DIPSTICK
Bilirubin, UA: NEGATIVE
GLUCOSE UA: NEGATIVE
Ketones, UA: NEGATIVE
Nitrite, UA: NEGATIVE
PH UA: 6.5
Protein, UA: NEGATIVE
RBC UA: NEGATIVE
SPEC GRAV UA: 1.01
UROBILINOGEN UA: 0.2

## 2013-04-24 LAB — HEPATIC FUNCTION PANEL
ALBUMIN: 4.1 g/dL (ref 3.5–5.2)
ALK PHOS: 74 U/L (ref 39–117)
ALT: 22 U/L (ref 0–35)
AST: 29 U/L (ref 0–37)
Bilirubin, Direct: 0.1 mg/dL (ref 0.0–0.3)
TOTAL PROTEIN: 7.5 g/dL (ref 6.0–8.3)
Total Bilirubin: 0.6 mg/dL (ref 0.3–1.2)

## 2013-04-24 LAB — BASIC METABOLIC PANEL
BUN: 18 mg/dL (ref 6–23)
CALCIUM: 9.3 mg/dL (ref 8.4–10.5)
CO2: 29 mEq/L (ref 19–32)
Chloride: 102 mEq/L (ref 96–112)
Creatinine, Ser: 0.7 mg/dL (ref 0.4–1.2)
GFR: 90.09 mL/min (ref >60.00)
GLUCOSE: 83 mg/dL (ref 70–99)
POTASSIUM: 4.4 meq/L (ref 3.5–5.1)
SODIUM: 137 meq/L (ref 135–145)

## 2013-04-24 LAB — TSH: TSH: 2.49 u[IU]/mL (ref 0.35–5.50)

## 2013-05-01 ENCOUNTER — Encounter: Payer: Self-pay | Admitting: Family Medicine

## 2013-05-01 ENCOUNTER — Ambulatory Visit (INDEPENDENT_AMBULATORY_CARE_PROVIDER_SITE_OTHER): Payer: BC Managed Care – PPO | Admitting: Family Medicine

## 2013-05-01 VITALS — BP 158/82 | HR 74 | Temp 99.2°F | Ht <= 58 in | Wt 166.0 lb

## 2013-05-01 DIAGNOSIS — Z0000 Encounter for general adult medical examination without abnormal findings: Secondary | ICD-10-CM

## 2013-05-01 DIAGNOSIS — E785 Hyperlipidemia, unspecified: Secondary | ICD-10-CM

## 2013-05-01 MED ORDER — LISINOPRIL 10 MG PO TABS: 10.0000 mg | ORAL_TABLET | Freq: Every day | ORAL | Status: DC

## 2013-05-01 MED ORDER — ROSUVASTATIN CALCIUM 10 MG PO TABS: 10.0000 mg | ORAL_TABLET | Freq: Every day | ORAL | Status: DC

## 2013-05-01 NOTE — Progress Notes (Signed)
   Subjective:    Patient ID: Robin Howe, female    DOB: [birth year], 63 y.o.   MRN: [id number]  HPI 63 yr old female for a cpx. She feels well. We have seen a steady rise in her BP over the past year.   Review of Systems  Constitutional: Negative.   HENT: Negative.   Eyes: Negative.   Respiratory: Negative.   Cardiovascular: Negative.   Gastrointestinal: Negative.   Genitourinary: Negative for dysuria, urgency, frequency, hematuria, flank pain, decreased urine volume, enuresis, difficulty urinating, pelvic pain and dyspareunia.  Musculoskeletal: Negative.   Skin: Negative.   Neurological: Negative.   Psychiatric/Behavioral: Negative.        Objective:   Physical Exam  Constitutional: She is oriented to person, place, and time. She appears well-developed and well-nourished. No distress.  HENT:  Head: Normocephalic and atraumatic.  Right Ear: External ear normal.  Left Ear: External ear normal.  Nose: Nose normal.  Mouth/Throat: Oropharynx is clear and moist. No oropharyngeal exudate.  Eyes: Conjunctivae and EOM are normal. Pupils are equal, round, and reactive to light. No scleral icterus.  Neck: Normal range of motion. Neck supple. No JVD present. No thyromegaly present.  Cardiovascular: Normal rate, regular rhythm, normal heart sounds and intact distal pulses.  Exam reveals no gallop and no friction rub.   No murmur heard. EKG normal  Pulmonary/Chest: Effort normal and breath sounds normal. No respiratory distress. She has no wheezes. She has no rales. She exhibits no tenderness.  Abdominal: Soft. Bowel sounds are normal. She exhibits no distension and no mass. There is no tenderness. There is no rebound and no guarding.  Musculoskeletal: Normal range of motion. She exhibits no edema and no tenderness.  Lymphadenopathy:    She has no cervical adenopathy.  Neurological: She is alert and oriented to person, place, and time. She has normal reflexes. No cranial nerve  deficit. She exhibits normal muscle tone. Coordination normal.  Skin: Skin is warm and dry. No rash noted. No erythema.  Psychiatric: She has a normal mood and affect. Her behavior is normal. Judgment and thought content normal.          Assessment & Plan:  Well exam. Start on Lisinopril 10 mg a day. Recheck in one month

## 2013-05-01 NOTE — Progress Notes (Signed)
Pre visit review using our clinic review tool, if applicable. No additional management support is needed unless otherwise documented below in the visit note. 

## 2013-06-04 ENCOUNTER — Encounter: Payer: Self-pay | Admitting: Family Medicine

## 2013-06-04 ENCOUNTER — Ambulatory Visit (INDEPENDENT_AMBULATORY_CARE_PROVIDER_SITE_OTHER): Payer: BC Managed Care – PPO | Admitting: Family Medicine

## 2013-06-04 VITALS — BP 144/80 | HR 74 | Temp 98.5°F | Ht <= 58 in | Wt 166.0 lb

## 2013-06-04 DIAGNOSIS — I10 Essential (primary) hypertension: Secondary | ICD-10-CM

## 2013-06-04 MED ORDER — LISINOPRIL 20 MG PO TABS: 20.0000 mg | ORAL_TABLET | Freq: Every day | ORAL | Status: DC

## 2013-06-04 NOTE — Progress Notes (Signed)
   Subjective:    Patient ID: Robin Howe, female    DOB: Aug 17, 1950, 63 y.o.   MRN: [id number]  HPI Here to recheck HTN. She has been on Lisinopril 10 mg daily for 5 weeks and she feels fine. She has not checked her BP since then.    Review of Systems  Constitutional: Negative.   Respiratory: Negative.   Cardiovascular: Negative.        Objective:   Physical Exam  Constitutional: She appears well-developed and well-nourished.  Cardiovascular: Normal rate, regular rhythm, normal heart sounds and intact distal pulses.   Pulmonary/Chest: Effort normal and breath sounds normal.          Assessment & Plan:  We will increase Lisinopril to 20 mg daily.

## 2013-06-04 NOTE — Progress Notes (Signed)
Pre visit review using our clinic review tool, if applicable. No additional management support is needed unless otherwise documented below in the visit note. 

## 2013-06-05 ENCOUNTER — Telehealth: Payer: Self-pay | Admitting: Family Medicine

## 2013-06-05 NOTE — Telephone Encounter (Signed)
Relevant patient education mailed to patient.  

## 2013-08-22 ENCOUNTER — Encounter: Payer: Self-pay | Admitting: Family Medicine

## 2013-10-08 ENCOUNTER — Telehealth: Payer: Self-pay | Admitting: Family Medicine

## 2013-10-08 ENCOUNTER — Encounter: Payer: Self-pay | Admitting: Family Medicine

## 2013-10-08 ENCOUNTER — Ambulatory Visit (INDEPENDENT_AMBULATORY_CARE_PROVIDER_SITE_OTHER): Payer: BC Managed Care – PPO | Admitting: Family Medicine

## 2013-10-08 VITALS — BP 146/82 | HR 68 | Temp 98.2°F | Ht <= 58 in | Wt 167.0 lb

## 2013-10-08 DIAGNOSIS — E785 Hyperlipidemia, unspecified: Secondary | ICD-10-CM

## 2013-10-08 DIAGNOSIS — I10 Essential (primary) hypertension: Secondary | ICD-10-CM

## 2013-10-08 MED ORDER — AMLODIPINE BESYLATE 5 MG PO TABS: 5.0000 mg | ORAL_TABLET | Freq: Every day | ORAL | Status: DC

## 2013-10-08 NOTE — Telephone Encounter (Signed)
Relevant patient education assigned to patient using Emmi. ° °

## 2013-10-08 NOTE — Progress Notes (Signed)
   Subjective:    Patient ID: Robin Howe, female    DOB: [birth year], 63 y.o.   MRN: [id number]  HPI Here to discuss a dry tickling cough she has had for a month. No other allergy sx, no GERD sx. She has been on Lisinopril for several months. She had an Doctor, general practice visit recently and her BP was 135/80.    Review of Systems  Constitutional: Negative.   HENT: Negative.   Eyes: Negative.   Respiratory: Positive for cough. Negative for chest tightness, shortness of breath and wheezing.   Cardiovascular: Negative.        Objective:   Physical Exam  Constitutional: She appears well-developed and well-nourished.  Cardiovascular: Normal rate, regular rhythm, normal heart sounds and intact distal pulses.   Pulmonary/Chest: Effort normal and breath sounds normal.          Assessment & Plan:  This sounds like an ACE inhibitor cough, so we will stop the Lisinopril. Switch to Amlodipine 5 mg daily. She will report back in one month.

## 2013-10-08 NOTE — Progress Notes (Signed)
Pre visit review using our clinic review tool, if applicable. No additional management support is needed unless otherwise documented below in the visit note. 

## 2013-10-14 ENCOUNTER — Encounter: Payer: Self-pay | Admitting: Family Medicine

## 2013-10-25 ENCOUNTER — Telehealth: Payer: Self-pay | Admitting: Family Medicine

## 2013-10-25 NOTE — Telephone Encounter (Signed)
lmom for pt to call back

## 2013-10-25 NOTE — Telephone Encounter (Signed)
She already has one year worth of refills, so she can stay on this

## 2013-10-25 NOTE — Telephone Encounter (Signed)
Here is blood pressure readings: July 7 B/P 131/70, July 11 126/70, July 15 107/58, July 18 118/77. Also pt's cough is gone. Refill request for Amlodipine. Encourage pt to use my chart instead of [facility] portal web site.

## 2013-10-25 NOTE — Telephone Encounter (Signed)
I left a voice message with below information. 

## 2013-10-25 NOTE — Telephone Encounter (Signed)
Can you print pt's activation number for my chart and put in mail to pt or does pt have to pick this up in person? I cannot get this information to print.

## 2013-10-28 NOTE — Telephone Encounter (Signed)
Pt called back and verified email address.

## 2013-10-30 ENCOUNTER — Encounter: Payer: Self-pay | Admitting: Family Medicine

## 2013-10-30 NOTE — Telephone Encounter (Signed)
Actually this is quite normal. Amlodipine slows the heart rate a little in addition to lowering the BP. Nothing to worry about

## 2013-11-22 ENCOUNTER — Encounter: Payer: Self-pay | Admitting: Family Medicine

## 2013-11-22 ENCOUNTER — Ambulatory Visit (INDEPENDENT_AMBULATORY_CARE_PROVIDER_SITE_OTHER): Payer: BC Managed Care – PPO | Admitting: Family Medicine

## 2013-11-22 VITALS — BP 120/80 | Temp 98.3°F | Wt 169.0 lb

## 2013-11-22 DIAGNOSIS — Z0000 Encounter for general adult medical examination without abnormal findings: Secondary | ICD-10-CM

## 2013-11-22 DIAGNOSIS — I10 Essential (primary) hypertension: Secondary | ICD-10-CM

## 2013-11-22 DIAGNOSIS — J3089 Other allergic rhinitis: Secondary | ICD-10-CM

## 2013-11-22 DIAGNOSIS — J309 Allergic rhinitis, unspecified: Secondary | ICD-10-CM | POA: Insufficient documentation

## 2013-11-22 DIAGNOSIS — Z23 Encounter for immunization: Secondary | ICD-10-CM

## 2013-11-22 NOTE — Progress Notes (Signed)
   Subjective:    Patient ID: Robin Howe, female    DOB: 12-31-1950, 63 y.o.   MRN: [id number]  HPI Here for several things. First she describes constant nasal and sinus congestion for the past 6 months. She takes Sudafed or Mucinex with mixed results. No ST or fever or cough. 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    Review of Systems  Constitutional: Negative.   HENT: Positive for congestion, postnasal drip and sinus pressure.   Eyes: Negative.   Respiratory: Negative.   Cardiovascular: Negative.        Objective:   Physical Exam  Constitutional: She appears well-developed and well-nourished.  HENT:  Right Ear: External ear normal.  Left Ear: External ear normal.  Nose: Nose normal.  Mouth/Throat: Oropharynx is clear and moist.  Eyes: Conjunctivae are normal.  Cardiovascular: Normal rate, regular rhythm, normal heart sounds and intact distal pulses.   Pulmonary/Chest: Effort normal and breath sounds normal.  Lymphadenopathy:    She has no cervical adenopathy.          Assessment & Plan:  Try Flonase sprays daily. Her form was filled out and a PPD was placed to be read on Monday.

## 2013-11-22 NOTE — Progress Notes (Signed)
Pre visit review using our clinic review tool, if applicable. No additional management support is needed unless otherwise documented below in the visit note. 

## 2013-11-25 LAB — TB SKIN TEST
Induration: 0 mm
TB Skin Test: NEGATIVE

## 2013-12-30 ENCOUNTER — Encounter: Payer: Self-pay | Admitting: Family Medicine

## 2013-12-30 NOTE — Telephone Encounter (Signed)
Actually a lower heart rate would have no effect on your metabolism. This would not affect your weight at all

## 2014-02-19 ENCOUNTER — Encounter: Payer: Self-pay | Admitting: Family Medicine

## 2014-02-19 ENCOUNTER — Ambulatory Visit (INDEPENDENT_AMBULATORY_CARE_PROVIDER_SITE_OTHER): Payer: BC Managed Care – PPO | Admitting: Family Medicine

## 2014-02-19 VITALS — BP 135/74 | HR 75 | Temp 98.3°F | Ht <= 58 in | Wt 168.0 lb

## 2014-02-19 DIAGNOSIS — H00013 Hordeolum externum right eye, unspecified eyelid: Secondary | ICD-10-CM

## 2014-02-19 DIAGNOSIS — N390 Urinary tract infection, site not specified: Secondary | ICD-10-CM

## 2014-02-19 LAB — POCT URINALYSIS DIPSTICK
Bilirubin, UA: NEGATIVE
Glucose, UA: NEGATIVE
Ketones, UA: NEGATIVE
NITRITE UA: NEGATIVE
Spec Grav, UA: 1.025
Urobilinogen, UA: 0.2
pH, UA: 6

## 2014-02-19 MED ORDER — CIPROFLOXACIN HCL 500 MG PO TABS: 500.0000 mg | ORAL_TABLET | Freq: Two times a day (BID) | ORAL | Status: DC

## 2014-02-19 MED ORDER — NEOMYCIN-POLYMYXIN-HC 3.5-10000-1 OP SUSP: 3.0000 [drp] | Freq: Four times a day (QID) | OPHTHALMIC | Status: DC

## 2014-02-19 NOTE — Progress Notes (Signed)
Pre visit review using our clinic review tool, if applicable. No additional management support is needed unless otherwise documented below in the visit note. 

## 2014-02-19 NOTE — Addendum Note (Signed)
Addended by: Aggie Hacker A on: 02/19/2014 01:15 PM   Modules accepted: Orders

## 2014-02-19 NOTE — Progress Notes (Signed)
   Subjective:    Patient ID: Robin Howe, female    DOB: 11/18/1950, 63 y.o.   MRN: [id number]  HPI Here for 3 days of "not feeling well" with some fatigue and some body aches. She has been urinating more frequently than normal but there is no pain or burning. Also for 2 days she has had a swollen tender right upper eyelid. No URI sx.   Review of Systems  Constitutional: Negative.   HENT: Negative.   Eyes: Negative.   Respiratory: Negative.   Gastrointestinal: Negative.   Genitourinary: Positive for urgency and frequency. Negative for dysuria, hematuria, flank pain and pelvic pain.       Objective:   Physical Exam  Constitutional: She appears well-developed and well-nourished. No distress.  HENT:  Right Ear: External ear normal.  Left Ear: External ear normal.  Nose: Nose normal.  Mouth/Throat: Oropharynx is clear and moist.  Eyes: Conjunctivae are normal. Right eye exhibits no discharge. Left eye exhibits no discharge.  Right upper lid is red, swollen, and mildly tender   Neck: Neck supple.  Cardiovascular: Normal rate, regular rhythm, normal heart sounds and intact distal pulses.   Pulmonary/Chest: Effort normal and breath sounds normal.  Abdominal: Soft. Bowel sounds are normal. She exhibits no distension and no mass. There is no tenderness. There is no rebound and no guarding.  Lymphadenopathy:    She has no cervical adenopathy.          Assessment & Plan:  Treat the stye with hot compresses and eye drops. Treat for probable UTI and culture the sample.

## 2014-02-20 LAB — URINE CULTURE
Colony Count: NO GROWTH
Organism ID, Bacteria: NO GROWTH

## 2014-03-05 ENCOUNTER — Encounter: Payer: Self-pay | Admitting: Family Medicine

## 2014-03-05 NOTE — Telephone Encounter (Signed)
This sounds like a blocked salivary gland. They will swell up quickly when you are eating because this stimulates the salivary glands to make more saliva. Usually they go down on their own over time. Warm compresses can help. Make an OV if needed

## 2014-04-24 ENCOUNTER — Other Ambulatory Visit: Payer: Self-pay | Admitting: Family Medicine

## 2014-05-22 ENCOUNTER — Other Ambulatory Visit (INDEPENDENT_AMBULATORY_CARE_PROVIDER_SITE_OTHER): Payer: BC Managed Care – PPO

## 2014-05-22 DIAGNOSIS — Z0000 Encounter for general adult medical examination without abnormal findings: Secondary | ICD-10-CM

## 2014-05-22 LAB — BASIC METABOLIC PANEL
BUN: 14 mg/dL (ref 6–23)
CO2: 30 meq/L (ref 19–32)
Calcium: 10 mg/dL (ref 8.4–10.5)
Chloride: 104 mEq/L (ref 96–112)
Creatinine, Ser: 0.71 mg/dL (ref 0.40–1.20)
GFR: 88.32 mL/min (ref >60.00)
Glucose, Bld: 88 mg/dL (ref 70–99)
POTASSIUM: 4.9 meq/L (ref 3.5–5.1)
SODIUM: 139 meq/L (ref 135–145)

## 2014-05-22 LAB — HEPATIC FUNCTION PANEL
ALBUMIN: 4.3 g/dL (ref 3.5–5.2)
ALK PHOS: 100 U/L (ref 39–117)
ALT: 17 U/L (ref 0–35)
AST: 24 U/L (ref 0–37)
Bilirubin, Direct: 0.1 mg/dL (ref 0.0–0.3)
Total Bilirubin: 0.5 mg/dL (ref 0.2–1.2)
Total Protein: 7.4 g/dL (ref 6.0–8.3)

## 2014-05-22 LAB — LIPID PANEL
CHOL/HDL RATIO: 3
Cholesterol: 149 mg/dL (ref 0–200)
HDL: 50.7 mg/dL (ref >39.00)
LDL CALC: 83 mg/dL (ref 0–99)
NonHDL: 98.3
Triglycerides: 78 mg/dL (ref 0.0–149.0)
VLDL: 15.6 mg/dL (ref 0.0–40.0)

## 2014-05-22 LAB — POCT URINALYSIS DIPSTICK
Bilirubin, UA: NEGATIVE
Blood, UA: NEGATIVE
GLUCOSE UA: NEGATIVE
KETONES UA: NEGATIVE
NITRITE UA: NEGATIVE
PH UA: 7.5
Protein, UA: NEGATIVE
Spec Grav, UA: 1.01
Urobilinogen, UA: 0.2

## 2014-05-22 LAB — TSH: TSH: 2.52 u[IU]/mL (ref 0.35–4.50)

## 2014-05-22 LAB — CBC WITH DIFFERENTIAL/PLATELET
BASOS ABS: 0.1 10*3/uL (ref 0.0–0.1)
BASOS PCT: 1.1 % (ref 0.0–3.0)
Eosinophils Absolute: 0.6 10*3/uL (ref 0.0–0.7)
Eosinophils Relative: 9.3 % — ABNORMAL HIGH (ref 0.0–5.0)
HCT: 40.8 % (ref 36.0–46.0)
Hemoglobin: 14.1 g/dL (ref 12.0–15.0)
LYMPHS ABS: 1.8 10*3/uL (ref 0.7–4.0)
LYMPHS PCT: 30.5 % (ref 12.0–46.0)
MCHC: 34.5 g/dL (ref 30.0–36.0)
MCV: 91.2 fl (ref 78.0–100.0)
MONO ABS: 0.3 10*3/uL (ref 0.1–1.0)
Monocytes Relative: 5.8 % (ref 3.0–12.0)
NEUTROS ABS: 3.2 10*3/uL (ref 1.4–7.7)
Neutrophils Relative %: 53.3 % (ref 43.0–77.0)
Platelets: 234 10*3/uL (ref 150.0–400.0)
RBC: 4.48 Mil/uL (ref 3.87–5.11)
RDW: 12.7 % (ref 11.5–15.5)
WBC: 6 10*3/uL (ref 4.0–10.5)

## 2014-05-27 ENCOUNTER — Encounter: Payer: Self-pay | Admitting: Family Medicine

## 2014-05-27 ENCOUNTER — Ambulatory Visit (INDEPENDENT_AMBULATORY_CARE_PROVIDER_SITE_OTHER): Payer: BC Managed Care – PPO | Admitting: Family Medicine

## 2014-05-27 VITALS — BP 122/74 | HR 66 | Temp 98.9°F | Ht <= 58 in | Wt 171.0 lb

## 2014-05-27 DIAGNOSIS — Z0000 Encounter for general adult medical examination without abnormal findings: Secondary | ICD-10-CM

## 2014-05-27 DIAGNOSIS — E785 Hyperlipidemia, unspecified: Secondary | ICD-10-CM

## 2014-05-27 NOTE — Progress Notes (Signed)
Pre visit review using our clinic review tool, if applicable. No additional management support is needed unless otherwise documented below in the visit note. 

## 2014-05-27 NOTE — Progress Notes (Signed)
   Subjective:    Patient ID: Robin Howe, female    DOB: 25-Apr-1950, 64 y.o.   MRN: [id number]  HPI 64 yr old female for a cpx. She feels well but asks about mild constipation. She averages a BM every other day. She drinks plenty of water and has tried prune juice.    Review of Systems  Constitutional: Negative.   HENT: Negative.   Eyes: Negative.   Respiratory: Negative.   Cardiovascular: Negative.   Gastrointestinal: Positive for constipation. Negative for nausea, vomiting, abdominal pain, diarrhea, blood in stool, abdominal distention, anal bleeding and rectal pain.  Genitourinary: Negative for dysuria, urgency, frequency, hematuria, flank pain, decreased urine volume, enuresis, difficulty urinating, pelvic pain and dyspareunia.  Musculoskeletal: Negative.   Skin: Negative.   Neurological: Negative.   Psychiatric/Behavioral: Negative.        Objective:   Physical Exam  Constitutional: She is oriented to person, place, and time. She appears well-developed and well-nourished. No distress.  HENT:  Head: Normocephalic and atraumatic.  Right Ear: External ear normal.  Left Ear: External ear normal.  Nose: Nose normal.  Mouth/Throat: Oropharynx is clear and moist. No oropharyngeal exudate.  Eyes: Conjunctivae and EOM are normal. Pupils are equal, round, and reactive to light. No scleral icterus.  Neck: Normal range of motion. Neck supple. No JVD present. No thyromegaly present.  Cardiovascular: Normal rate, regular rhythm, normal heart sounds and intact distal pulses.  Exam reveals no gallop and no friction rub.   No murmur heard. EKG normal   Pulmonary/Chest: Effort normal and breath sounds normal. No respiratory distress. She has no wheezes. She has no rales. She exhibits no tenderness.  Abdominal: Soft. Bowel sounds are normal. She exhibits no distension and no mass. There is no tenderness. There is no rebound and no guarding.  Musculoskeletal: Normal range of motion. She  exhibits no edema or tenderness.  Lymphadenopathy:    She has no cervical adenopathy.  Neurological: She is alert and oriented to person, place, and time. She has normal reflexes. No cranial nerve deficit. She exhibits normal muscle tone. Coordination normal.  Skin: Skin is warm and dry. No rash noted. No erythema.  Psychiatric: She has a normal mood and affect. Her behavior is normal. Judgment and thought content normal.          Assessment & Plan:  Well exam. Try Miralax daily

## 2014-06-17 ENCOUNTER — Encounter: Payer: Self-pay | Admitting: Family Medicine

## 2014-08-13 ENCOUNTER — Encounter: Payer: Self-pay | Admitting: Family Medicine

## 2014-09-24 ENCOUNTER — Other Ambulatory Visit: Payer: Self-pay | Admitting: Family Medicine

## 2014-10-10 ENCOUNTER — Encounter: Payer: Self-pay | Admitting: Family Medicine

## 2014-10-14 NOTE — Telephone Encounter (Signed)
Tell her that her pharmacy needs to send over such requests so we can begin the process of preauthorizing it, make it to Tonya's attention

## 2014-10-23 ENCOUNTER — Other Ambulatory Visit: Payer: Self-pay | Admitting: Family Medicine

## 2014-12-19 ENCOUNTER — Ambulatory Visit (INDEPENDENT_AMBULATORY_CARE_PROVIDER_SITE_OTHER): Payer: BC Managed Care – PPO | Admitting: Family Medicine

## 2014-12-19 ENCOUNTER — Encounter: Payer: Self-pay | Admitting: Family Medicine

## 2014-12-19 VITALS — BP 138/61 | HR 71 | Temp 98.1°F | Ht <= 58 in | Wt 171.0 lb

## 2014-12-19 DIAGNOSIS — Z23 Encounter for immunization: Secondary | ICD-10-CM

## 2014-12-19 DIAGNOSIS — M545 Low back pain, unspecified: Secondary | ICD-10-CM

## 2014-12-19 MED ORDER — METHYLPREDNISOLONE 4 MG PO TBPK: ORAL_TABLET | ORAL | Status: DC

## 2014-12-19 NOTE — Progress Notes (Signed)
   Subjective:    Patient ID: Robin Howe, female    DOB: [birth year], 64 y.o.   MRN: [id number]  HPI Here for 6 weeks of intermittent pain in the lower back. No recent trauma. She had a laminectomy in 2003 and a lumbar fusion in 2006, although I cannot find in her chart which levels were involved then. She takes Aleve and sits on a special cushion. No pain in the legs.    Review of Systems  Constitutional: Negative.   Musculoskeletal: Positive for back pain.       Objective:   Physical Exam  Constitutional: She appears well-developed and well-nourished. No distress.  Musculoskeletal:  She is tender in the lower back directly over the L5-S1 area. ROM is full, negative SLR           Assessment & Plan:  Possible herniated disc. Rest, use heat. Given a Medrol dose pack.

## 2014-12-19 NOTE — Progress Notes (Signed)
Pre visit review using our clinic review tool, if applicable. No additional management support is needed unless otherwise documented below in the visit note. 

## 2014-12-23 ENCOUNTER — Telehealth: Payer: Self-pay | Admitting: Family Medicine

## 2014-12-23 NOTE — Telephone Encounter (Signed)
Pt wants to know when colonoscopy is due?

## 2014-12-24 ENCOUNTER — Encounter: Payer: Self-pay | Admitting: Family Medicine

## 2014-12-24 NOTE — Telephone Encounter (Signed)
Due in November (her last scope was on 02-04-10)

## 2014-12-25 NOTE — Telephone Encounter (Signed)
Duplicate, please see previous note, was sent to pt through my chart.

## 2015-01-19 ENCOUNTER — Encounter: Payer: Self-pay | Admitting: Family Medicine

## 2015-02-10 ENCOUNTER — Telehealth: Payer: Self-pay | Admitting: Family Medicine

## 2015-02-10 DIAGNOSIS — Z0000 Encounter for general adult medical examination without abnormal findings: Secondary | ICD-10-CM

## 2015-02-10 NOTE — Telephone Encounter (Signed)
Pt call to say that when she had her physical earlier part of this year that it was mention to her that it was time for her to have a colonscopy. She call to ask if this is still going to happen. Would like a call back

## 2015-02-11 NOTE — Telephone Encounter (Signed)
I sent a referral to GI

## 2015-02-11 NOTE — Telephone Encounter (Signed)
I spoke with pt  

## 2015-02-12 ENCOUNTER — Encounter: Payer: Self-pay | Admitting: Gastroenterology

## 2015-02-24 ENCOUNTER — Encounter: Payer: Self-pay | Admitting: Family Medicine

## 2015-02-25 NOTE — Telephone Encounter (Signed)
Switch to Lipitor 20 mg daily. Call in one year supply

## 2015-03-02 MED ORDER — ATORVASTATIN CALCIUM 20 MG PO TABS: 20.0000 mg | ORAL_TABLET | Freq: Every day | ORAL | Status: DC

## 2015-03-16 ENCOUNTER — Encounter: Payer: Self-pay | Admitting: Gastroenterology

## 2015-04-01 ENCOUNTER — Ambulatory Visit (AMBULATORY_SURGERY_CENTER): Payer: Self-pay

## 2015-04-01 VITALS — Ht 59.0 in | Wt 175.6 lb

## 2015-04-01 DIAGNOSIS — Z860100 Personal history of colon polyps, unspecified: Secondary | ICD-10-CM

## 2015-04-01 MED ORDER — SUPREP BOWEL PREP KIT 17.5-3.13-1.6 GM/177ML PO SOLN: 1.0000 | Freq: Once | ORAL | Status: DC

## 2015-04-01 NOTE — Progress Notes (Signed)
No allergies to eggs or soy No diet/weight loss meds No home oxygen No past problems with anesthesia except 1989 during c-section had heart problems which she has been cleared for since  Has email and internet; refused emmi

## 2015-04-13 ENCOUNTER — Encounter: Payer: BC Managed Care – PPO | Admitting: Gastroenterology

## 2015-05-13 ENCOUNTER — Ambulatory Visit (AMBULATORY_SURGERY_CENTER): Payer: BC Managed Care – PPO | Admitting: Gastroenterology

## 2015-05-13 ENCOUNTER — Encounter: Payer: Self-pay | Admitting: Gastroenterology

## 2015-05-13 VITALS — BP 116/71 | HR 66 | Temp 97.5°F | Resp 15 | Ht 59.0 in | Wt 175.0 lb

## 2015-05-13 DIAGNOSIS — D123 Benign neoplasm of transverse colon: Secondary | ICD-10-CM

## 2015-05-13 DIAGNOSIS — Z86010 Personal history of colonic polyps: Secondary | ICD-10-CM

## 2015-05-13 HISTORY — PX: COLONOSCOPY: SHX174

## 2015-05-13 MED ORDER — SODIUM CHLORIDE 0.9 % IV SOLN: 500.0000 mL | INTRAVENOUS | Status: DC

## 2015-05-13 NOTE — Progress Notes (Signed)
Patient awakening,vss,report to rn 

## 2015-05-13 NOTE — Patient Instructions (Signed)
YOU HAD AN ENDOSCOPIC PROCEDURE TODAY AT THE [facility]:   Refer to the procedure report that was given to you for any specific questions about what was found during the examination.  If the procedure report does not answer your questions, please call your gastroenterologist to clarify.  If you requested that your care partner not be given the details of your procedure findings, then the procedure report has been included in a sealed envelope for you to review at your convenience later.  YOU SHOULD EXPECT: Some feelings of bloating in the abdomen. Passage of more gas than usual.  Walking can help get rid of the air that was put into your GI tract during the procedure and reduce the bloating. If you had a lower endoscopy (such as a colonoscopy or flexible sigmoidoscopy) you may notice spotting of blood in your stool or on the toilet paper. If you underwent a bowel prep for your procedure, you may not have a normal bowel movement for a few days.  Please Note:  You might notice some irritation and congestion in your nose or some drainage.  This is from the oxygen used during your procedure.  There is no need for concern and it should clear up in a day or so.  SYMPTOMS TO REPORT IMMEDIATELY:   Following lower endoscopy (colonoscopy or flexible sigmoidoscopy):  Excessive amounts of blood in the stool  Significant tenderness or worsening of abdominal pains  Swelling of the abdomen that is new, acute  Fever of 100F or higher  For urgent or emergent issues, a gastroenterologist can be reached at any hour by calling [phone].   DIET: Your first meal following the procedure should be a small meal and then it is ok to progress to your normal diet. Heavy or fried foods are harder to digest and may make you feel nauseous or bloated.  Likewise, meals heavy in dairy and vegetables can increase bloating.  Drink plenty of fluids but you should avoid alcoholic beverages for 24  hours.  ACTIVITY:  You should plan to take it easy for the rest of today and you should NOT DRIVE or use heavy machinery until tomorrow (because of the sedation medicines used during the test).    FOLLOW UP: Our staff will call the number listed on your records the next business day following your procedure to check on you and address any questions or concerns that you may have regarding the information given to you following your procedure. If we do not reach you, we will leave a message.  However, if you are feeling well and you are not experiencing any problems, there is no need to return our call.  We will assume that you have returned to your regular daily activities without incident.  If any biopsies were taken you will be contacted by phone or by letter within the next 1-3 weeks.  Please call us at [phone] if you have not heard about the biopsies in 3 weeks.    SIGNATURES/CONFIDENTIALITY: You and/or your care partner have signed paperwork which will be entered into your electronic medical record.  These signatures attest to the fact that that the information above on your After Visit Summary has been reviewed and is understood.  Full responsibility of the confidentiality of this discharge information lies with you and/or your care-partner.  Please review polyp handout provided. Next colonoscopy determined by pathology results; 5 or 10 years. 

## 2015-05-13 NOTE — Op Note (Signed)
[place]  [address] [place], [id number]   COLONOSCOPY PROCEDURE REPORT  PATIENT: Robin Howe, Robin Howe  MR#: [id number] BIRTHDATE: 11-23-1950 , 64  yrs. old GENDER: female ENDOSCOPIST: Ladene Artist, MD, [facility] PROCEDURE DATE:  05/13/2015 PROCEDURE:   Colonoscopy, surveillance , Colonoscopy with biopsy, and Colonoscopy with snare polypectomy First Screening Colonoscopy - Avg.  risk and is 50 yrs.  old or older - No.  Prior Negative Screening - Now for repeat screening. N/A  History of Adenoma - Now for follow-up colonoscopy & has been > or = to 3 yrs.  Yes hx of adenoma.  Has been 3 or more years since last colonoscopy.  Polyps removed today? Yes ASA CLASS:   Class II INDICATIONS:Surveillance due to prior colonic neoplasia and PH Colon Adenoma. MEDICATIONS: Monitored anesthesia care and Propofol 200 mg IV  DESCRIPTION OF PROCEDURE:   After the risks benefits and alternatives of the procedure were thoroughly explained, informed consent was obtained.  The digital rectal exam revealed no abnormalities of the rectum.   The [id number]  endoscope was introduced through the anus and advanced to the cecum, which was identified by both the appendix and ileocecal valve. No adverse events experienced.   The quality of the prep was excellent. (Suprep was used)  The instrument was then slowly withdrawn as the colon was fully examined. Estimated blood loss is zero unless otherwise noted in this procedure report.    COLON FINDINGS: Two sessile polyps measuring 4-6 mm in size were found in the transverse colon.  Polypectomies were performed with a cold forceps and with cold snare respectively.  The resection was complete, the polyp tissue was completely retrieved and sent to histology.   The colonic mucosa appeared normal at the splenic flexure, in the descending colon, rectum, sigmoid colon, at the ileocecal valve, cecum, hepatic flexure, and in the  ascending colon.  Retroflexed views revealed no abnormalities. The time to cecum = 3.3 Withdrawal time = 9.9   The scope was withdrawn and the procedure completed. COMPLICATIONS: There were no immediate complications.  ENDOSCOPIC IMPRESSION: 1.   Two sessile polyps in the transverse colon; polypectomies performed with a cold snare and with cold forceps 2.   The colonic mucosa otherwise appeared normal  RECOMMENDATIONS: 1.  Await pathology results 2.  Repeat colonoscopy in 5 years if polyp(s) adenomatous; otherwise 10 years  eSigned:  Ladene Artist, MD, [facility] 05/13/2015 2:51 PM

## 2015-05-13 NOTE — Progress Notes (Signed)
Called to room to assist during endoscopic procedure.  Patient ID and intended procedure confirmed with present staff. Received instructions for my participation in the procedure from the performing physician.  

## 2015-05-14 ENCOUNTER — Telehealth: Payer: Self-pay

## 2015-05-14 NOTE — Telephone Encounter (Signed)
  Follow up Call-  Call back number 05/13/2015  Post procedure Call Back phone  # [phone]  Permission to leave phone message Yes     Patient questions:  Do you have a fever, pain , or abdominal swelling? No. Pain Score  0 *  Have you tolerated food without any problems? Yes.    Have you been able to return to your normal activities? Yes.    Do you have any questions about your discharge instructions: Diet   No. Medications  No. Follow up visit  No.  Do you have questions or concerns about your Care? No.  Actions: * If pain score is 4 or above: No action needed, pain <4.

## 2015-05-19 ENCOUNTER — Encounter: Payer: Self-pay | Admitting: Gastroenterology

## 2015-06-29 ENCOUNTER — Other Ambulatory Visit (INDEPENDENT_AMBULATORY_CARE_PROVIDER_SITE_OTHER): Payer: BC Managed Care – PPO

## 2015-06-29 DIAGNOSIS — Z0000 Encounter for general adult medical examination without abnormal findings: Secondary | ICD-10-CM | POA: Diagnosis not present

## 2015-06-29 LAB — BASIC METABOLIC PANEL
BUN: 15 mg/dL (ref 6–23)
CHLORIDE: 102 meq/L (ref 96–112)
CO2: 30 meq/L (ref 19–32)
Calcium: 9.9 mg/dL (ref 8.4–10.5)
Creatinine, Ser: 0.76 mg/dL (ref 0.40–1.20)
GFR: 81.36 mL/min (ref >60.00)
GLUCOSE: 101 mg/dL — AB (ref 70–99)
POTASSIUM: 4.5 meq/L (ref 3.5–5.1)
SODIUM: 142 meq/L (ref 135–145)

## 2015-06-29 LAB — LIPID PANEL
CHOLESTEROL: 174 mg/dL (ref 0–200)
HDL: 56.8 mg/dL (ref >39.00)
LDL Cholesterol: 101 mg/dL — ABNORMAL HIGH (ref 0–99)
NONHDL: 117.02
Total CHOL/HDL Ratio: 3
Triglycerides: 79 mg/dL (ref 0.0–149.0)
VLDL: 15.8 mg/dL (ref 0.0–40.0)

## 2015-06-29 LAB — CBC WITH DIFFERENTIAL/PLATELET
Basophils Absolute: 0.1 10*3/uL (ref 0.0–0.1)
Basophils Relative: 1 % (ref 0.0–3.0)
EOS PCT: 7.4 % — AB (ref 0.0–5.0)
Eosinophils Absolute: 0.5 10*3/uL (ref 0.0–0.7)
HEMATOCRIT: 40.8 % (ref 36.0–46.0)
Hemoglobin: 14.1 g/dL (ref 12.0–15.0)
LYMPHS ABS: 1.8 10*3/uL (ref 0.7–4.0)
LYMPHS PCT: 25.8 % (ref 12.0–46.0)
MCHC: 34.7 g/dL (ref 30.0–36.0)
MCV: 90.8 fl (ref 78.0–100.0)
MONOS PCT: 6 % (ref 3.0–12.0)
Monocytes Absolute: 0.4 10*3/uL (ref 0.1–1.0)
NEUTROS ABS: 4.1 10*3/uL (ref 1.4–7.7)
NEUTROS PCT: 59.8 % (ref 43.0–77.0)
PLATELETS: 282 10*3/uL (ref 150.0–400.0)
RBC: 4.5 Mil/uL (ref 3.87–5.11)
RDW: 12.8 % (ref 11.5–15.5)
WBC: 6.9 10*3/uL (ref 4.0–10.5)

## 2015-06-29 LAB — POC URINALSYSI DIPSTICK (AUTOMATED)
BILIRUBIN UA: NEGATIVE
Glucose, UA: NEGATIVE
Ketones, UA: NEGATIVE
LEUKOCYTES UA: NEGATIVE
NITRITE UA: NEGATIVE
PH UA: 7.5
PROTEIN UA: NEGATIVE
RBC UA: NEGATIVE
Spec Grav, UA: 1.015
UROBILINOGEN UA: 0.2

## 2015-06-29 LAB — HEPATIC FUNCTION PANEL
ALBUMIN: 4.5 g/dL (ref 3.5–5.2)
ALK PHOS: 95 U/L (ref 39–117)
ALT: 17 U/L (ref 0–35)
AST: 24 U/L (ref 0–37)
Bilirubin, Direct: 0.1 mg/dL (ref 0.0–0.3)
TOTAL PROTEIN: 7.3 g/dL (ref 6.0–8.3)
Total Bilirubin: 0.7 mg/dL (ref 0.2–1.2)

## 2015-06-29 LAB — TSH: TSH: 4.53 u[IU]/mL — AB (ref 0.35–4.50)

## 2015-07-06 ENCOUNTER — Ambulatory Visit (INDEPENDENT_AMBULATORY_CARE_PROVIDER_SITE_OTHER): Payer: BC Managed Care – PPO | Admitting: Family Medicine

## 2015-07-06 ENCOUNTER — Encounter: Payer: Self-pay | Admitting: Family Medicine

## 2015-07-06 VITALS — BP 121/74 | HR 73 | Temp 98.6°F | Ht 59.0 in | Wt 174.0 lb

## 2015-07-06 DIAGNOSIS — Z0000 Encounter for general adult medical examination without abnormal findings: Secondary | ICD-10-CM

## 2015-07-06 NOTE — Progress Notes (Signed)
   Subjective:    Patient ID: Robin Howe, female    DOB: Apr 30, 1950, 65 y.o.   MRN: [id number]  HPI 65 yr old female for a cpx. She feels well. Her recent labs are remarkable for a borderline glucose of 101 and a borderline TSH of 4.53.    Review of Systems  Constitutional: Negative.   HENT: Negative.   Eyes: Negative.   Respiratory: Negative.   Cardiovascular: Negative.   Gastrointestinal: Negative.   Genitourinary: Negative for dysuria, urgency, frequency, hematuria, flank pain, decreased urine volume, enuresis, difficulty urinating, pelvic pain and dyspareunia.  Musculoskeletal: Negative.   Skin: Negative.   Neurological: Negative.   Psychiatric/Behavioral: Negative.        Objective:   Physical Exam  Constitutional: She is oriented to person, place, and time. She appears well-developed and well-nourished. No distress.  HENT:  Head: Normocephalic and atraumatic.  Right Ear: External ear normal.  Left Ear: External ear normal.  Nose: Nose normal.  Mouth/Throat: Oropharynx is clear and moist. No oropharyngeal exudate.  Eyes: Conjunctivae and EOM are normal. Pupils are equal, round, and reactive to light. No scleral icterus.  Neck: Normal range of motion. Neck supple. No JVD present. No thyromegaly present.  Cardiovascular: Normal rate, regular rhythm, normal heart sounds and intact distal pulses.  Exam reveals no gallop and no friction rub.   No murmur heard. EKG normal   Pulmonary/Chest: Effort normal and breath sounds normal. No respiratory distress. She has no wheezes. She has no rales. She exhibits no tenderness.  Abdominal: Soft. Bowel sounds are normal. She exhibits no distension and no mass. There is no tenderness. There is no rebound and no guarding.  Musculoskeletal: Normal range of motion. She exhibits no edema or tenderness.  Lymphadenopathy:    She has no cervical adenopathy.  Neurological: She is alert and oriented to person, place, and time. She has  normal reflexes. No cranial nerve deficit. She exhibits normal muscle tone. Coordination normal.  Skin: Skin is warm and dry. No rash noted. No erythema.  Psychiatric: She has a normal mood and affect. Her behavior is normal. Judgment and thought content normal.          Assessment & Plan:  Well exam. We discussed diet and exercise. Recheck a thyroid panel and an A1c in 90 days . Laurey Morale, MD

## 2015-07-06 NOTE — Progress Notes (Signed)
Pre visit review using our clinic review tool, if applicable. No additional management support is needed unless otherwise documented below in the visit note. 

## 2015-08-17 ENCOUNTER — Encounter: Payer: Self-pay | Admitting: Family Medicine

## 2015-08-17 LAB — HM MAMMOGRAPHY

## 2015-09-24 ENCOUNTER — Other Ambulatory Visit: Payer: Self-pay | Admitting: Family Medicine

## 2015-09-24 NOTE — Telephone Encounter (Signed)
Refill sent to pharmacy.   

## 2016-02-12 ENCOUNTER — Ambulatory Visit (INDEPENDENT_AMBULATORY_CARE_PROVIDER_SITE_OTHER): Payer: BC Managed Care – PPO | Admitting: Family Medicine

## 2016-02-12 ENCOUNTER — Encounter: Payer: Self-pay | Admitting: Family Medicine

## 2016-02-12 VITALS — BP 138/75 | HR 79 | Temp 98.5°F | Ht 59.0 in | Wt 171.0 lb

## 2016-02-12 DIAGNOSIS — R946 Abnormal results of thyroid function studies: Secondary | ICD-10-CM | POA: Diagnosis not present

## 2016-02-12 DIAGNOSIS — M159 Polyosteoarthritis, unspecified: Secondary | ICD-10-CM

## 2016-02-12 DIAGNOSIS — M150 Primary generalized (osteo)arthritis: Secondary | ICD-10-CM

## 2016-02-12 DIAGNOSIS — R7989 Other specified abnormal findings of blood chemistry: Secondary | ICD-10-CM

## 2016-02-12 LAB — TSH: TSH: 3.83 u[IU]/mL (ref 0.35–4.50)

## 2016-02-12 LAB — T3, FREE: T3, Free: 3.2 pg/mL (ref 2.3–4.2)

## 2016-02-12 LAB — T4, FREE: FREE T4: 0.65 ng/dL (ref 0.60–1.60)

## 2016-02-12 MED ORDER — MELOXICAM 15 MG PO TABS: 15.0000 mg | ORAL_TABLET | Freq: Every day | ORAL | 3 refills | Status: DC

## 2016-02-12 NOTE — Progress Notes (Signed)
   Subjective:    Patient ID: Robin Howe, female    DOB: 05-11-1950, 65 y.o.   MRN: [id number]  HPI Here to discuss her neck and back pain. She has diffuse arthritis and she takes Naproxen for this with mixed results. She has seen chiropractors in the past with good results. Also we need ot recheck her thyroid levels since her TSH was abnormal in March.    Review of Systems  Constitutional: Negative.   Respiratory: Negative.   Cardiovascular: Negative.   Endocrine: Negative.   Musculoskeletal: Positive for back pain, neck pain and neck stiffness.       Objective:   Physical Exam  Constitutional: She is oriented to person, place, and time. She appears well-developed and well-nourished.  Neck: No thyromegaly present.  Cardiovascular: Normal rate, regular rhythm, normal heart sounds and intact distal pulses.   Pulmonary/Chest: Effort normal and breath sounds normal.  Musculoskeletal:  Neck is tender and has limited ROM   Lymphadenopathy:    She has no cervical adenopathy.  Neurological: She is alert and oriented to person, place, and time.          Assessment & Plan:  Neck pain, she will try Meloxicam 15 mg daily. Refer to Jola Baptist DC. Get labs for her thyroid today.  Laurey Morale, MD

## 2016-02-12 NOTE — Progress Notes (Signed)
Pre visit review using our clinic review tool, if applicable. No additional management support is needed unless otherwise documented below in the visit note. 

## 2016-02-21 ENCOUNTER — Other Ambulatory Visit: Payer: Self-pay | Admitting: Family Medicine

## 2016-02-24 ENCOUNTER — Encounter: Payer: Self-pay | Admitting: Family Medicine

## 2016-04-04 HISTORY — PX: CATARACT EXTRACTION, BILATERAL: SHX1313

## 2016-07-15 ENCOUNTER — Encounter: Payer: Self-pay | Admitting: Family Medicine

## 2016-07-15 ENCOUNTER — Ambulatory Visit (INDEPENDENT_AMBULATORY_CARE_PROVIDER_SITE_OTHER): Payer: Medicare Other | Admitting: Family Medicine

## 2016-07-15 VITALS — BP 115/79 | HR 74 | Temp 98.4°F | Ht 59.0 in | Wt 176.0 lb

## 2016-07-15 DIAGNOSIS — Z0000 Encounter for general adult medical examination without abnormal findings: Secondary | ICD-10-CM

## 2016-07-15 MED ORDER — AMLODIPINE BESYLATE 5 MG PO TABS: 5.0000 mg | ORAL_TABLET | Freq: Every day | ORAL | 3 refills | Status: DC

## 2016-07-15 NOTE — Progress Notes (Signed)
   Subjective:    Patient ID: Robin Howe, female    DOB: Jun 05, 1950, 66 y.o.   MRN: [id number]  HPI 66 yr old female for a well exam. She feels fine.    Review of Systems  Constitutional: Negative.   HENT: Negative.   Eyes: Negative.   Respiratory: Negative.   Cardiovascular: Negative.   Gastrointestinal: Negative.   Genitourinary: Negative for decreased urine volume, difficulty urinating, dyspareunia, dysuria, enuresis, flank pain, frequency, hematuria, pelvic pain and urgency.  Musculoskeletal: Negative.   Skin: Negative.   Neurological: Negative.   Psychiatric/Behavioral: Negative.        Objective:   Physical Exam  Constitutional: She is oriented to person, place, and time. She appears well-developed and well-nourished. No distress.  HENT:  Head: Normocephalic and atraumatic.  Right Ear: External ear normal.  Left Ear: External ear normal.  Nose: Nose normal.  Mouth/Throat: Oropharynx is clear and moist. No oropharyngeal exudate.  Eyes: Conjunctivae and EOM are normal. Pupils are equal, round, and reactive to light. No scleral icterus.  Neck: Normal range of motion. Neck supple. No JVD present. No thyromegaly present.  Cardiovascular: Normal rate, regular rhythm, normal heart sounds and intact distal pulses.  Exam reveals no gallop and no friction rub.   No murmur heard. Pulmonary/Chest: Effort normal and breath sounds normal. No respiratory distress. She has no wheezes. She has no rales. She exhibits no tenderness.  Abdominal: Soft. Bowel sounds are normal. She exhibits no distension and no mass. There is no tenderness. There is no rebound and no guarding.  Musculoskeletal: Normal range of motion. She exhibits no edema or tenderness.  Lymphadenopathy:    She has no cervical adenopathy.  Neurological: She is alert and oriented to person, place, and time. She has normal reflexes. No cranial nerve deficit. She exhibits normal muscle tone. Coordination normal.  Skin:  Skin is warm and dry. No rash noted. No erythema.  Psychiatric: She has a normal mood and affect. Her behavior is normal. Judgment and thought content normal.          Assessment & Plan:  Well exam. We discussed diet and exercise.  [name], MD

## 2016-07-15 NOTE — Progress Notes (Signed)
Pre visit review using our clinic review tool, if applicable. No additional management support is needed unless otherwise documented below in the visit note. 

## 2016-07-15 NOTE — Patient Instructions (Signed)
WE NOW OFFER   [facility] FAST TRACK!!!  SAME DAY Appointments for ACUTE CARE  Such as: Sprains, Injuries, cuts, abrasions, rashes, muscle pain, joint pain, back pain Colds, flu, sore throats, headache, allergies, cough, fever  Ear pain, sinus and eye infections Abdominal pain, nausea, vomiting, diarrhea, upset stomach Animal/insect bites  3 Easy Ways to Schedule: Walk-In Scheduling Call in scheduling Mychart Sign-up: [email or web address]         

## 2016-07-19 ENCOUNTER — Other Ambulatory Visit (INDEPENDENT_AMBULATORY_CARE_PROVIDER_SITE_OTHER): Payer: Medicare Other

## 2016-07-19 DIAGNOSIS — Z0000 Encounter for general adult medical examination without abnormal findings: Secondary | ICD-10-CM

## 2016-07-19 LAB — POC URINALSYSI DIPSTICK (AUTOMATED)
BILIRUBIN UA: NEGATIVE
Blood, UA: NEGATIVE
Glucose, UA: NEGATIVE
Ketones, UA: NEGATIVE
LEUKOCYTES UA: NEGATIVE
NITRITE UA: NEGATIVE
Protein, UA: NEGATIVE
Spec Grav, UA: 1.02 (ref 1.010–1.025)
UROBILINOGEN UA: 0.2 U/dL
pH, UA: 6 (ref 5.0–8.0)

## 2016-07-19 LAB — LIPID PANEL
CHOL/HDL RATIO: 3
Cholesterol: 159 mg/dL (ref 0–200)
HDL: 50.7 mg/dL (ref >39.00)
LDL Cholesterol: 92 mg/dL (ref 0–99)
NONHDL: 108.09
Triglycerides: 79 mg/dL (ref 0.0–149.0)
VLDL: 15.8 mg/dL (ref 0.0–40.0)

## 2016-07-19 LAB — CBC WITH DIFFERENTIAL/PLATELET
BASOS ABS: 0.1 10*3/uL (ref 0.0–0.1)
Basophils Relative: 1.4 % (ref 0.0–3.0)
EOS ABS: 0.5 10*3/uL (ref 0.0–0.7)
Eosinophils Relative: 7.1 % — ABNORMAL HIGH (ref 0.0–5.0)
HEMATOCRIT: 39.8 % (ref 36.0–46.0)
Hemoglobin: 13.5 g/dL (ref 12.0–15.0)
LYMPHS PCT: 24.2 % (ref 12.0–46.0)
Lymphs Abs: 1.7 10*3/uL (ref 0.7–4.0)
MCHC: 34 g/dL (ref 30.0–36.0)
MCV: 91 fl (ref 78.0–100.0)
MONOS PCT: 7 % (ref 3.0–12.0)
Monocytes Absolute: 0.5 10*3/uL (ref 0.1–1.0)
NEUTROS ABS: 4.1 10*3/uL (ref 1.4–7.7)
Neutrophils Relative %: 60.3 % (ref 43.0–77.0)
PLATELETS: 283 10*3/uL (ref 150.0–400.0)
RBC: 4.37 Mil/uL (ref 3.87–5.11)
RDW: 13 % (ref 11.5–15.5)
WBC: 6.9 10*3/uL (ref 4.0–10.5)

## 2016-07-19 LAB — BASIC METABOLIC PANEL
BUN: 17 mg/dL (ref 6–23)
CALCIUM: 9.8 mg/dL (ref 8.4–10.5)
CO2: 30 mEq/L (ref 19–32)
CREATININE: 0.77 mg/dL (ref 0.40–1.20)
Chloride: 106 mEq/L (ref 96–112)
GFR: 79.88 mL/min (ref >60.00)
GLUCOSE: 95 mg/dL (ref 70–99)
Potassium: 4.7 mEq/L (ref 3.5–5.1)
SODIUM: 142 meq/L (ref 135–145)

## 2016-07-19 LAB — HEPATIC FUNCTION PANEL
ALBUMIN: 4.3 g/dL (ref 3.5–5.2)
ALT: 13 U/L (ref 0–35)
AST: 20 U/L (ref 0–37)
Alkaline Phosphatase: 101 U/L (ref 39–117)
Bilirubin, Direct: 0.1 mg/dL (ref 0.0–0.3)
TOTAL PROTEIN: 7.1 g/dL (ref 6.0–8.3)
Total Bilirubin: 0.5 mg/dL (ref 0.2–1.2)

## 2016-07-19 LAB — TSH: TSH: 4.53 u[IU]/mL — AB (ref 0.35–4.50)

## 2016-08-22 LAB — HM MAMMOGRAPHY

## 2016-08-23 ENCOUNTER — Encounter: Payer: Self-pay | Admitting: Family Medicine

## 2016-08-23 ENCOUNTER — Ambulatory Visit (INDEPENDENT_AMBULATORY_CARE_PROVIDER_SITE_OTHER): Payer: Medicare Other | Admitting: Family Medicine

## 2016-08-23 VITALS — BP 138/86 | HR 90 | Temp 98.6°F | Wt 172.0 lb

## 2016-08-23 DIAGNOSIS — R350 Frequency of micturition: Secondary | ICD-10-CM

## 2016-08-23 DIAGNOSIS — G4700 Insomnia, unspecified: Secondary | ICD-10-CM | POA: Insufficient documentation

## 2016-08-23 LAB — POC URINALSYSI DIPSTICK (AUTOMATED)
Bilirubin, UA: NEGATIVE
Glucose, UA: NEGATIVE
KETONES UA: NEGATIVE
Leukocytes, UA: NEGATIVE
Nitrite, UA: NEGATIVE
PH UA: 6 (ref 5.0–8.0)
Protein, UA: NEGATIVE
RBC UA: NEGATIVE
Spec Grav, UA: 1.025 (ref 1.010–1.025)
UROBILINOGEN UA: 0.2 U/dL

## 2016-08-23 MED ORDER — TEMAZEPAM 30 MG PO CAPS: 30.0000 mg | ORAL_CAPSULE | Freq: Every evening | ORAL | 2 refills | Status: DC | PRN

## 2016-08-23 NOTE — Patient Instructions (Signed)
WE NOW OFFER   [facility] FAST TRACK!!!  SAME DAY Appointments for ACUTE CARE  Such as: Sprains, Injuries, cuts, abrasions, rashes, muscle pain, joint pain, back pain Colds, flu, sore throats, headache, allergies, cough, fever  Ear pain, sinus and eye infections Abdominal pain, nausea, vomiting, diarrhea, upset stomach Animal/insect bites  3 Easy Ways to Schedule: Walk-In Scheduling Call in scheduling Mychart Sign-up: [email or web address]         

## 2016-08-23 NOTE — Progress Notes (Signed)
   Subjective:    Patient ID: Robin Howe, female    DOB: 1950-09-20, 66 y.o.   MRN: [id number]  HPI Here for 2 issues. First she has had increased frequency of urinations for a week. No burning or fever. Second she has had increased trouble sleeping lately. She takes Tylenol PM which used to help, but not any more. She has trouble relaxing at night, and she admits to dealing with a lot fo stress in her life.    Review of Systems  Constitutional: Negative.   Respiratory: Negative.   Cardiovascular: Negative.   Gastrointestinal: Negative.   Genitourinary: Positive for frequency and urgency. Negative for dysuria, flank pain, hematuria and pelvic pain.  Psychiatric/Behavioral: Positive for sleep disturbance. Negative for agitation, confusion, decreased concentration and dysphoric mood. The patient is nervous/anxious.        Objective:   Physical Exam  Constitutional: She is oriented to person, place, and time. She appears well-developed and well-nourished.  Cardiovascular: Normal rate, regular rhythm, normal heart sounds and intact distal pulses.   Pulmonary/Chest: Effort normal and breath sounds normal. No respiratory distress. She has no wheezes. She has no rales.  Neurological: She is alert and oriented to person, place, and time.  Psychiatric: She has a normal mood and affect. Her behavior is normal. Thought content normal.          Assessment & Plan:  Her urine is clear. For insomnia she will try Temazepam. Recheck prn [name], MD

## 2016-08-24 ENCOUNTER — Telehealth: Payer: Self-pay | Admitting: Family Medicine

## 2016-08-24 NOTE — Telephone Encounter (Signed)
Alternative request to change Temazepam, insurance prefers Trazodone or Belsoma.

## 2016-08-24 NOTE — Telephone Encounter (Signed)
Okay. Call in Trazodone 50 mg to take qhs, #30 with 2 rf

## 2016-08-24 NOTE — Telephone Encounter (Signed)
I spoke with pt, she did pick up script , however before next refill I is due she would like to try 1 of the alternatives.

## 2016-08-26 ENCOUNTER — Encounter: Payer: Self-pay | Admitting: Family Medicine

## 2016-08-26 MED ORDER — TRAZODONE HCL 50 MG PO TABS: 25.0000 mg | ORAL_TABLET | Freq: Every evening | ORAL | 2 refills | Status: DC | PRN

## 2016-08-26 MED ORDER — TRAZODONE HCL 50 MG PO TABS: 50.0000 mg | ORAL_TABLET | Freq: Every evening | ORAL | 2 refills | Status: DC | PRN

## 2016-08-26 NOTE — Telephone Encounter (Signed)
I sent [facility] to [facility], removed Temazepam from current medication list and spoke with pt

## 2016-11-17 ENCOUNTER — Other Ambulatory Visit: Payer: Self-pay | Admitting: Family Medicine

## 2016-12-22 ENCOUNTER — Encounter: Payer: Self-pay | Admitting: Family Medicine

## 2017-01-16 ENCOUNTER — Other Ambulatory Visit: Payer: Self-pay | Admitting: Family Medicine

## 2017-01-16 NOTE — Telephone Encounter (Signed)
Can we refill this? Looks like a possibel allergy alert.

## 2017-03-02 ENCOUNTER — Telehealth: Payer: Self-pay

## 2017-03-02 NOTE — Telephone Encounter (Signed)
Alternative requested: Pt is paying $24 for atorvastatin. Pt is requesting lower out of pocket cost. See alternatives and associated cost. Simvastatin is $10. Sent to PCP

## 2017-03-03 NOTE — Telephone Encounter (Signed)
Stop Atorvastatin and call in Simvastatin 40 mg daily , #90 with 3 rf

## 2017-03-03 NOTE — Telephone Encounter (Signed)
Pt stated that she did not asked for this the pharmacy had just suggested it for the pt. Pt stated that she is fine with staying on the atorvastatin since this medication works well for her. She doesn't mind paying extra.

## 2017-07-17 ENCOUNTER — Other Ambulatory Visit: Payer: Self-pay | Admitting: Family Medicine

## 2017-07-28 ENCOUNTER — Encounter: Payer: Self-pay | Admitting: Family Medicine

## 2017-07-28 NOTE — Telephone Encounter (Signed)
Please advise request.

## 2017-07-31 NOTE — Telephone Encounter (Signed)
We can call in a half dose. Call in Temazepam 15 mg qhs, #30 with 2 rf

## 2017-08-30 ENCOUNTER — Encounter: Payer: Self-pay | Admitting: Family Medicine

## 2017-08-30 ENCOUNTER — Ambulatory Visit (INDEPENDENT_AMBULATORY_CARE_PROVIDER_SITE_OTHER): Payer: Medicare Other | Admitting: Family Medicine

## 2017-08-30 VITALS — BP 138/72 | HR 75 | Temp 98.5°F | Ht <= 58 in | Wt 176.9 lb

## 2017-08-30 DIAGNOSIS — G4700 Insomnia, unspecified: Secondary | ICD-10-CM | POA: Diagnosis not present

## 2017-08-30 DIAGNOSIS — M150 Primary generalized (osteo)arthritis: Secondary | ICD-10-CM | POA: Diagnosis not present

## 2017-08-30 DIAGNOSIS — M810 Age-related osteoporosis without current pathological fracture: Secondary | ICD-10-CM | POA: Diagnosis not present

## 2017-08-30 DIAGNOSIS — I10 Essential (primary) hypertension: Secondary | ICD-10-CM

## 2017-08-30 DIAGNOSIS — M159 Polyosteoarthritis, unspecified: Secondary | ICD-10-CM

## 2017-08-30 DIAGNOSIS — K219 Gastro-esophageal reflux disease without esophagitis: Secondary | ICD-10-CM

## 2017-08-30 DIAGNOSIS — E785 Hyperlipidemia, unspecified: Secondary | ICD-10-CM

## 2017-08-30 LAB — HEPATIC FUNCTION PANEL
ALBUMIN: 4.3 g/dL (ref 3.5–5.2)
ALT: 19 U/L (ref 0–35)
AST: 26 U/L (ref 0–37)
Alkaline Phosphatase: 102 U/L (ref 39–117)
BILIRUBIN TOTAL: 0.4 mg/dL (ref 0.2–1.2)
Bilirubin, Direct: 0.1 mg/dL (ref 0.0–0.3)
TOTAL PROTEIN: 7.4 g/dL (ref 6.0–8.3)

## 2017-08-30 LAB — BASIC METABOLIC PANEL
BUN: 16 mg/dL (ref 6–23)
CHLORIDE: 102 meq/L (ref 96–112)
CO2: 28 meq/L (ref 19–32)
Calcium: 9.7 mg/dL (ref 8.4–10.5)
Creatinine, Ser: 0.71 mg/dL (ref 0.40–1.20)
GFR: 87.42 mL/min (ref >60.00)
GLUCOSE: 91 mg/dL (ref 70–99)
POTASSIUM: 4.5 meq/L (ref 3.5–5.1)
Sodium: 139 mEq/L (ref 135–145)

## 2017-08-30 LAB — CBC WITH DIFFERENTIAL/PLATELET
BASOS ABS: 0.1 10*3/uL (ref 0.0–0.1)
Basophils Relative: 1.1 % (ref 0.0–3.0)
Eosinophils Absolute: 0.5 10*3/uL (ref 0.0–0.7)
Eosinophils Relative: 5.2 % — ABNORMAL HIGH (ref 0.0–5.0)
HCT: 40.8 % (ref 36.0–46.0)
Hemoglobin: 14 g/dL (ref 12.0–15.0)
LYMPHS ABS: 2.4 10*3/uL (ref 0.7–4.0)
Lymphocytes Relative: 26.2 % (ref 12.0–46.0)
MCHC: 34.3 g/dL (ref 30.0–36.0)
MCV: 90.7 fl (ref 78.0–100.0)
MONOS PCT: 6.3 % (ref 3.0–12.0)
Monocytes Absolute: 0.6 10*3/uL (ref 0.1–1.0)
NEUTROS ABS: 5.6 10*3/uL (ref 1.4–7.7)
NEUTROS PCT: 61.2 % (ref 43.0–77.0)
PLATELETS: 313 10*3/uL (ref 150.0–400.0)
RBC: 4.49 Mil/uL (ref 3.87–5.11)
RDW: 14.1 % (ref 11.5–15.5)
WBC: 9.1 10*3/uL (ref 4.0–10.5)

## 2017-08-30 LAB — POC URINALSYSI DIPSTICK (AUTOMATED)
BILIRUBIN UA: NEGATIVE
Blood, UA: NEGATIVE
Glucose, UA: NEGATIVE
KETONES UA: NEGATIVE
Nitrite, UA: NEGATIVE
PH UA: 6 (ref 5.0–8.0)
Protein, UA: NEGATIVE
SPEC GRAV UA: 1.025 (ref 1.010–1.025)
Urobilinogen, UA: 0.2 E.U./dL

## 2017-08-30 LAB — LIPID PANEL
Cholesterol: 162 mg/dL (ref 0–200)
HDL: 51.1 mg/dL (ref >39.00)
LDL Cholesterol: 87 mg/dL (ref 0–99)
NonHDL: 110.68
TRIGLYCERIDES: 116 mg/dL (ref 0.0–149.0)
Total CHOL/HDL Ratio: 3
VLDL: 23.2 mg/dL (ref 0.0–40.0)

## 2017-08-30 LAB — TSH: TSH: 2.95 u[IU]/mL (ref 0.35–4.50)

## 2017-08-30 MED ORDER — ATORVASTATIN CALCIUM 20 MG PO TABS: 20.0000 mg | ORAL_TABLET | Freq: Every day | ORAL | 3 refills | Status: DC

## 2017-08-30 NOTE — Progress Notes (Signed)
   Subjective:    Patient ID: Robin Howe, female    DOB: 11/13/1950, 67 y.o.   MRN: [id number]  HPI Here to follow up on issues. She feels well. Her BP is stable. She stopped taking Trazodone because ot was too strong and she felt groggy the next morning. Her arthritis pains come and go.    Review of Systems  Constitutional: Negative.   HENT: Negative.   Eyes: Negative.   Respiratory: Negative.   Cardiovascular: Negative.   Gastrointestinal: Negative.   Genitourinary: Negative for decreased urine volume, difficulty urinating, dyspareunia, dysuria, enuresis, flank pain, frequency, hematuria, pelvic pain and urgency.  Musculoskeletal: Positive for arthralgias.  Skin: Negative.   Neurological: Negative.   Psychiatric/Behavioral: Negative.        Objective:   Physical Exam  Constitutional: She is oriented to person, place, and time. She appears well-developed and well-nourished. No distress.  HENT:  Head: Normocephalic and atraumatic.  Right Ear: External ear normal.  Left Ear: External ear normal.  Nose: Nose normal.  Mouth/Throat: Oropharynx is clear and moist. No oropharyngeal exudate.  Eyes: Pupils are equal, round, and reactive to light. Conjunctivae and EOM are normal. No scleral icterus.  Neck: Normal range of motion. Neck supple. No JVD present. No thyromegaly present.  Cardiovascular: Normal rate, regular rhythm, normal heart sounds and intact distal pulses. Exam reveals no gallop and no friction rub.  No murmur heard. Pulmonary/Chest: Effort normal and breath sounds normal. No respiratory distress. She has no wheezes. She has no rales. She exhibits no tenderness.  Abdominal: Soft. Bowel sounds are normal. She exhibits no distension and no mass. There is no tenderness. There is no rebound and no guarding.  Musculoskeletal: Normal range of motion. She exhibits no edema or tenderness.  Lymphadenopathy:    She has no cervical adenopathy.  Neurological: She is alert and  oriented to person, place, and time. She has normal reflexes. She displays normal reflexes. No cranial nerve deficit. She exhibits normal muscle tone. Coordination normal.  Skin: Skin is warm and dry. No rash noted. No erythema.  Psychiatric: She has a normal mood and affect. Her behavior is normal. Judgment and thought content normal.          Assessment & Plan:  Her HTN is stable. Osteoarthritis is stable. She will get fasting labs drawn today to check lipids, etc . [name], MD

## 2017-09-04 LAB — HM MAMMOGRAPHY

## 2017-09-05 ENCOUNTER — Ambulatory Visit (INDEPENDENT_AMBULATORY_CARE_PROVIDER_SITE_OTHER)
Admission: RE | Admit: 2017-09-05 | Discharge: 2017-09-05 | Disposition: A | Discharge status: Home / Self Care | Payer: Medicare Other | Source: Ambulatory Visit | Attending: Family Medicine | Admitting: Family Medicine

## 2017-09-05 DIAGNOSIS — M810 Age-related osteoporosis without current pathological fracture: Secondary | ICD-10-CM | POA: Diagnosis not present

## 2017-09-11 ENCOUNTER — Ambulatory Visit: Payer: Self-pay

## 2017-09-11 NOTE — Telephone Encounter (Signed)
Phone call to pt., and advised of bone density results; informed of recommendations per Dr. Sarajane Jews of starting Vita D 2000 units/ day, and Calcium 1500 mg / day, per result note of  09/07/17.  Verb. Understanding.

## 2018-01-11 ENCOUNTER — Other Ambulatory Visit: Payer: Self-pay | Admitting: Family Medicine

## 2018-01-12 ENCOUNTER — Ambulatory Visit: Payer: Medicare Other | Admitting: Family Medicine

## 2018-01-12 ENCOUNTER — Encounter: Payer: Self-pay | Admitting: Family Medicine

## 2018-01-12 VITALS — BP 132/80 | HR 72 | Temp 97.8°F | Wt 179.4 lb

## 2018-01-12 DIAGNOSIS — M5431 Sciatica, right side: Secondary | ICD-10-CM | POA: Diagnosis not present

## 2018-01-12 LAB — BASIC METABOLIC PANEL WITH GFR
BUN: 18 mg/dL (ref 6–23)
CO2: 28 meq/L (ref 19–32)
Calcium: 9.9 mg/dL (ref 8.4–10.5)
Chloride: 103 meq/L (ref 96–112)
Creatinine, Ser: 0.7 mg/dL (ref 0.40–1.20)
GFR: 88.76 mL/min (ref >60.00)
Glucose, Bld: 99 mg/dL (ref 70–99)
Potassium: 4.5 meq/L (ref 3.5–5.1)
Sodium: 141 meq/L (ref 135–145)

## 2018-01-12 MED ORDER — METHYLPREDNISOLONE 4 MG PO TBPK: ORAL_TABLET | ORAL | 0 refills | Status: DC

## 2018-01-12 NOTE — Progress Notes (Signed)
   Subjective:    Patient ID: Robin Howe, female    DOB: [birth year], 67 y.o.   MRN: [id number]  HPI Here for right sides low back pain that radiates into the right thigh. No recent trauma. She had fusion surgery in 2006 and did well for a time, but this pain has been going on now for 3 months. The right leg will feel numb and tingle at times. No weakness. She takes Meloxicam every day.    Review of Systems  Constitutional: Negative.   Respiratory: Negative.   Cardiovascular: Negative.   Musculoskeletal: Positive for back pain.  Neurological: Positive for numbness. Negative for weakness.       Objective:   Physical Exam  Constitutional: She is oriented to person, place, and time. She appears well-developed and well-nourished.  Cardiovascular: Normal rate, regular rhythm, normal heart sounds and intact distal pulses.  Pulmonary/Chest: Effort normal and breath sounds normal.  Musculoskeletal:  She is tender in the right lower back and over the right sciatic notch., ROM is full. SLR are negative.   Neurological: She is alert and oriented to person, place, and time.          Assessment & Plan:  Sciatica. Given a Medrol dose pack. Set up an contrasted lumbar MRI soon.  [name], MD

## 2018-01-15 ENCOUNTER — Encounter: Payer: Self-pay | Admitting: *Deleted

## 2018-01-17 ENCOUNTER — Encounter: Payer: Self-pay | Admitting: Family Medicine

## 2018-01-17 NOTE — Telephone Encounter (Signed)
Dr. Fry please advise. Thanks  

## 2018-01-17 NOTE — Telephone Encounter (Signed)
Yes go back on Meloxicam tomorrow

## 2018-01-22 ENCOUNTER — Ambulatory Visit
Admission: RE | Admit: 2018-01-22 | Discharge: 2018-01-22 | Disposition: A | Discharge status: Home / Self Care | Payer: Medicare Other | Source: Ambulatory Visit | Attending: Family Medicine | Admitting: Family Medicine

## 2018-01-22 DIAGNOSIS — M5431 Sciatica, right side: Secondary | ICD-10-CM

## 2018-01-22 MED ORDER — GADOBENATE DIMEGLUMINE 529 MG/ML IV SOLN
15.0000 mL | Freq: Once | INTRAVENOUS | Status: AC | PRN
  Administered 2018-01-22: 15 mL via INTRAVENOUS

## 2018-01-24 NOTE — Addendum Note (Signed)
Addended by: [name] A on: 01/24/2018 10:33 AM   Modules accepted: Orders

## 2018-01-25 ENCOUNTER — Encounter: Payer: Self-pay | Admitting: Family Medicine

## 2018-01-30 ENCOUNTER — Encounter: Payer: Self-pay | Admitting: Family Medicine

## 2018-05-16 ENCOUNTER — Encounter: Payer: Self-pay | Admitting: Family Medicine

## 2018-05-16 ENCOUNTER — Ambulatory Visit: Payer: Medicare Other | Admitting: Family Medicine

## 2018-05-16 VITALS — BP 110/68 | HR 72 | Temp 98.8°F | Ht <= 58 in | Wt 174.2 lb

## 2018-05-16 DIAGNOSIS — J019 Acute sinusitis, unspecified: Secondary | ICD-10-CM | POA: Diagnosis not present

## 2018-05-16 MED ORDER — AZITHROMYCIN 250 MG PO TABS
ORAL_TABLET | ORAL | 0 refills | Status: DC
Start: 2018-05-16 — End: 2018-10-17

## 2018-05-16 NOTE — Progress Notes (Signed)
   Subjective:    Patient ID: Robin Howe, female    DOB: [birth year], 68 y.o.   MRN: [id number]  HPI Here for 4 days of stuffy head, PND, blowing green mucus from the nose, and a dry cough. No fever. On Mucinex.    Review of Systems  Constitutional: Negative.   HENT: Positive for congestion, postnasal drip, sinus pressure and sinus pain. Negative for sore throat.   Eyes: Negative.   Respiratory: Positive for cough.        Objective:   Physical Exam Constitutional:      Appearance: She is not ill-appearing.  HENT:     Nose: Nose normal.     Mouth/Throat:     Pharynx: Oropharynx is clear.  Eyes:     Conjunctiva/sclera: Conjunctivae normal.  Pulmonary:     Effort: Pulmonary effort is normal.     Breath sounds: Normal breath sounds.  Lymphadenopathy:     Cervical: No cervical adenopathy.  Neurological:     Mental Status: She is alert.           Assessment & Plan:  Sinusitis, treat with a Zpack.  [name], MD

## 2018-06-18 ENCOUNTER — Encounter: Payer: Self-pay | Admitting: Family Medicine

## 2018-06-18 NOTE — Telephone Encounter (Signed)
Dr. Fry please advise. Thanks  

## 2018-06-19 NOTE — Telephone Encounter (Signed)
Call in Tramadol 50 mg to take 2 tabs every 6 hours prn pain, #60 with one rf 

## 2018-06-20 MED ORDER — TRAMADOL HCL 50 MG PO TABS: ORAL_TABLET | ORAL | 2 refills | Status: DC

## 2018-07-04 ENCOUNTER — Other Ambulatory Visit: Payer: Self-pay | Admitting: Family Medicine

## 2018-09-05 ENCOUNTER — Other Ambulatory Visit: Payer: Self-pay | Admitting: Family Medicine

## 2018-09-07 ENCOUNTER — Telehealth: Payer: Self-pay | Admitting: Family Medicine

## 2018-09-07 NOTE — Telephone Encounter (Signed)
Copied from [place] [id number]. Topic: Quick Communication - Rx Refill/Question >> Sep 07, 2018 11:06 AM Leward Quan A wrote: Medication: atorvastatin (LIPITOR) 20 MG tablet   Has the patient contacted their pharmacy? Yes.   (Agent: If no, request that the patient contact the pharmacy for the refill.) (Agent: If yes, when and what did the pharmacy advise?)  Preferred Pharmacy (with phone number or street name):   [id number] [place], [place] [phone] (Phone) [phone] (Fax)    Agent: Please be advised that RX refills may take up to 3 business days. We ask that you follow-up with your pharmacy.

## 2018-09-10 MED ORDER — ATORVASTATIN CALCIUM 20 MG PO TABS: 20.0000 mg | ORAL_TABLET | Freq: Every day | ORAL | 0 refills | Status: DC

## 2018-09-24 ENCOUNTER — Encounter: Payer: Self-pay | Admitting: Family Medicine

## 2018-10-04 ENCOUNTER — Encounter: Payer: Self-pay | Admitting: Family Medicine

## 2018-10-04 NOTE — Telephone Encounter (Signed)
Most adults need to take 10-20 mg at night to be effective

## 2018-10-04 NOTE — Telephone Encounter (Signed)
Dr. Sarajane Jews please advise on the dosage of melatonin.  Thanks

## 2018-10-15 ENCOUNTER — Encounter: Payer: Self-pay | Admitting: Family Medicine

## 2018-10-17 ENCOUNTER — Other Ambulatory Visit: Payer: Self-pay

## 2018-10-17 ENCOUNTER — Encounter: Payer: Self-pay | Admitting: Family Medicine

## 2018-10-17 ENCOUNTER — Ambulatory Visit: Payer: Medicare Other | Admitting: Family Medicine

## 2018-10-17 VITALS — BP 126/80 | HR 89 | Temp 98.7°F | Wt 175.8 lb

## 2018-10-17 DIAGNOSIS — F411 Generalized anxiety disorder: Secondary | ICD-10-CM | POA: Diagnosis not present

## 2018-10-17 DIAGNOSIS — G4700 Insomnia, unspecified: Secondary | ICD-10-CM

## 2018-10-17 MED ORDER — SERTRALINE HCL 50 MG PO TABS: 50.0000 mg | ORAL_TABLET | Freq: Every day | ORAL | 5 refills | Status: DC

## 2018-10-17 MED ORDER — ALPRAZOLAM 0.5 MG PO TABS: 0.5000 mg | ORAL_TABLET | Freq: Every evening | ORAL | 5 refills | Status: DC | PRN

## 2018-10-17 NOTE — Progress Notes (Signed)
   Subjective:    Patient ID: Robin Howe, female    DOB: 06-03-1950, 68 y.o.   MRN: [id number]  HPI Here to discuss anxiety and trouble sleeping. For the past 6 months she has been anxious a lot and sometimes she gets depressed. Now with the virus pandemic she is very stressed most of the time. She also had trouble falling to sleep at night and she lies in bed worrying about things. She tried Temazepam several years ago but found it to be too strong and too sedating. Lately she has been taking melatonin up to 10 mg at bedtime with no benefit. Of note she took Zoloft about 15 years ago after her husband's death and this worked well for her.    Review of Systems  Constitutional: Negative.   Respiratory: Negative.   Cardiovascular: Negative.   Psychiatric/Behavioral: Positive for decreased concentration, dysphoric mood and sleep disturbance. Negative for agitation, behavioral problems, confusion, hallucinations, self-injury and suicidal ideas. The patient is nervous/anxious.        Objective:   Physical Exam Constitutional:      Appearance: Normal appearance.  Cardiovascular:     Rate and Rhythm: Normal rate and regular rhythm.     Pulses: Normal pulses.     Heart sounds: Normal heart sounds.  Pulmonary:     Effort: Pulmonary effort is normal.     Breath sounds: Normal breath sounds.  Neurological:     Mental Status: She is alert.  Psychiatric:        Mood and Affect: Mood normal.        Behavior: Behavior normal.        Thought Content: Thought content normal.        Judgment: Judgment normal.           Assessment & Plan:  Anxiety with insomnia. She will try Zoloft 50 mg in the mornings and Xanax 0.5 mg at bedtime. Recheck in 3-4 weeks at her well exam. We spent 25 minutes together discussing these issues. [name], MD

## 2018-10-18 ENCOUNTER — Encounter: Payer: Self-pay | Admitting: Family Medicine

## 2018-10-19 ENCOUNTER — Telehealth: Payer: Self-pay | Admitting: Family Medicine

## 2018-10-19 MED ORDER — CIPROFLOXACIN HCL 500 MG PO TABS: 500.0000 mg | ORAL_TABLET | Freq: Two times a day (BID) | ORAL | 0 refills | Status: DC

## 2018-10-19 NOTE — Telephone Encounter (Signed)
Pt is having no energy, pain with urination, and thinks that she has a uti.  She is asking for abx to be called in.  She was in for an appt on 10/17/18 and doesn't want to come back into office. She says she does not know how to do virtual visit.  pharm is [facility]

## 2018-10-19 NOTE — Telephone Encounter (Signed)
I sent in some Cipro

## 2018-10-23 MED ORDER — NITROFURANTOIN MONOHYD MACRO 100 MG PO CAPS: 100.0000 mg | ORAL_CAPSULE | Freq: Two times a day (BID) | ORAL | 0 refills | Status: DC

## 2018-10-23 NOTE — Telephone Encounter (Signed)
Call in Macrobid 100 mg bid for 7 days  

## 2018-10-24 NOTE — Telephone Encounter (Signed)
No I am sorry. Forget the Macrobid and stay on the Cipro

## 2018-11-08 ENCOUNTER — Encounter: Payer: Self-pay | Admitting: Family Medicine

## 2018-11-08 ENCOUNTER — Ambulatory Visit (INDEPENDENT_AMBULATORY_CARE_PROVIDER_SITE_OTHER): Payer: Medicare Other | Admitting: Family Medicine

## 2018-11-08 VITALS — BP 140/64 | HR 78 | Temp 98.7°F | Wt 173.8 lb

## 2018-11-08 DIAGNOSIS — Z0000 Encounter for general adult medical examination without abnormal findings: Secondary | ICD-10-CM | POA: Diagnosis not present

## 2018-11-08 DIAGNOSIS — Z23 Encounter for immunization: Secondary | ICD-10-CM

## 2018-11-08 LAB — BASIC METABOLIC PANEL
BUN: 10 mg/dL (ref 6–23)
CO2: 29 mEq/L (ref 19–32)
Calcium: 10.1 mg/dL (ref 8.4–10.5)
Chloride: 103 mEq/L (ref 96–112)
Creatinine, Ser: 0.76 mg/dL (ref 0.40–1.20)
GFR: 75.76 mL/min (ref >60.00)
Glucose, Bld: 95 mg/dL (ref 70–99)
Potassium: 4.5 mEq/L (ref 3.5–5.1)
Sodium: 141 mEq/L (ref 135–145)

## 2018-11-08 LAB — HEPATIC FUNCTION PANEL
ALT: 19 U/L (ref 0–35)
AST: 28 U/L (ref 0–37)
Albumin: 4.6 g/dL (ref 3.5–5.2)
Alkaline Phosphatase: 108 U/L (ref 39–117)
Bilirubin, Direct: 0.1 mg/dL (ref 0.0–0.3)
Total Bilirubin: 0.5 mg/dL (ref 0.2–1.2)
Total Protein: 7.3 g/dL (ref 6.0–8.3)

## 2018-11-08 LAB — CBC WITH DIFFERENTIAL/PLATELET
Basophils Absolute: 0.1 10*3/uL (ref 0.0–0.1)
Basophils Relative: 1.2 % (ref 0.0–3.0)
Eosinophils Absolute: 0.3 10*3/uL (ref 0.0–0.7)
Eosinophils Relative: 4.7 % (ref 0.0–5.0)
HCT: 41.6 % (ref 36.0–46.0)
Hemoglobin: 14.2 g/dL (ref 12.0–15.0)
Lymphocytes Relative: 20.3 % (ref 12.0–46.0)
Lymphs Abs: 1.3 10*3/uL (ref 0.7–4.0)
MCHC: 34.1 g/dL (ref 30.0–36.0)
MCV: 95.9 fl (ref 78.0–100.0)
Monocytes Absolute: 0.5 10*3/uL (ref 0.1–1.0)
Monocytes Relative: 7.5 % (ref 3.0–12.0)
Neutro Abs: 4.4 10*3/uL (ref 1.4–7.7)
Neutrophils Relative %: 66.3 % (ref 43.0–77.0)
Platelets: 314 10*3/uL (ref 150.0–400.0)
RBC: 4.34 Mil/uL (ref 3.87–5.11)
RDW: 12.9 % (ref 11.5–15.5)
WBC: 6.6 10*3/uL (ref 4.0–10.5)

## 2018-11-08 LAB — POC URINALSYSI DIPSTICK (AUTOMATED)
Bilirubin, UA: NEGATIVE
Blood, UA: NEGATIVE
Glucose, UA: NEGATIVE
Ketones, UA: NEGATIVE
Leukocytes, UA: NEGATIVE
Nitrite, UA: NEGATIVE
Protein, UA: NEGATIVE
Spec Grav, UA: 1.015 (ref 1.010–1.025)
Urobilinogen, UA: 0.2 E.U./dL
pH, UA: 6.5 (ref 5.0–8.0)

## 2018-11-08 LAB — LIPID PANEL
Cholesterol: 167 mg/dL (ref 0–200)
HDL: 61 mg/dL (ref >39.00)
LDL Cholesterol: 89 mg/dL (ref 0–99)
NonHDL: 106.05
Total CHOL/HDL Ratio: 3
Triglycerides: 85 mg/dL (ref 0.0–149.0)
VLDL: 17 mg/dL (ref 0.0–40.0)

## 2018-11-08 LAB — TSH: TSH: 2.72 u[IU]/mL (ref 0.35–4.50)

## 2018-11-08 MED ORDER — SERTRALINE HCL 25 MG PO TABS: 25.0000 mg | ORAL_TABLET | Freq: Every day | ORAL | 3 refills | Status: DC

## 2018-11-08 MED ORDER — ATORVASTATIN CALCIUM 20 MG PO TABS: 20.0000 mg | ORAL_TABLET | Freq: Every day | ORAL | 3 refills | Status: DC

## 2018-11-08 NOTE — Progress Notes (Signed)
   Subjective:    Patient ID: Robin Howe, female    DOB: 11-04-1950, 68 y.o.   MRN: [id number]  HPI Here for a well exam. She feels great today. We recently started her on Xanax at bedtime for sleep, and this is working well. She also started on Sertraline for anxiety, and she found 50 mg to be too strong. She is taking 1/2 a pill a day, and this is working very well for her.    Review of Systems  Constitutional: Negative.   HENT: Negative.   Eyes: Negative.   Respiratory: Negative.   Cardiovascular: Negative.   Gastrointestinal: Negative.   Genitourinary: Negative for decreased urine volume, difficulty urinating, dyspareunia, dysuria, enuresis, flank pain, frequency, hematuria, pelvic pain and urgency.  Musculoskeletal: Negative.   Skin: Negative.   Neurological: Negative.   Psychiatric/Behavioral: Negative.        Objective:   Physical Exam Constitutional:      General: She is not in acute distress.    Appearance: She is well-developed.  HENT:     Head: Normocephalic and atraumatic.     Right Ear: External ear normal.     Left Ear: External ear normal.     Nose: Nose normal.     Mouth/Throat:     Pharynx: No oropharyngeal exudate.  Eyes:     General: No scleral icterus.    Conjunctiva/sclera: Conjunctivae normal.     Pupils: Pupils are equal, round, and reactive to light.  Neck:     Musculoskeletal: Normal range of motion and neck supple.     Thyroid: No thyromegaly.     Vascular: No JVD.  Cardiovascular:     Rate and Rhythm: Normal rate and regular rhythm.     Heart sounds: Normal heart sounds. No murmur. No friction rub. No gallop.   Pulmonary:     Effort: Pulmonary effort is normal. No respiratory distress.     Breath sounds: Normal breath sounds. No wheezing or rales.  Chest:     Chest wall: No tenderness.  Abdominal:     General: Bowel sounds are normal. There is no distension.     Palpations: Abdomen is soft. There is no mass.     Tenderness: There  is no abdominal tenderness. There is no guarding or rebound.  Musculoskeletal: Normal range of motion.        General: No tenderness.  Lymphadenopathy:     Cervical: No cervical adenopathy.  Skin:    General: Skin is warm and dry.     Findings: No erythema or rash.  Neurological:     Mental Status: She is alert and oriented to person, place, and time.     Cranial Nerves: No cranial nerve deficit.     Motor: No abnormal muscle tone.     Coordination: Coordination normal.     Deep Tendon Reflexes: Reflexes are normal and symmetric. Reflexes normal.  Psychiatric:        Behavior: Behavior normal.        Thought Content: Thought content normal.        Judgment: Judgment normal.           Assessment & Plan:  Well exam. We discussed diet and exercise. Get fasting labs. Given a Prevnar vaccine. For the anxiety we agreed to keep the Sertraline dose to 25 mg daily.  [name], MD

## 2018-11-16 LAB — HM MAMMOGRAPHY

## 2018-11-20 ENCOUNTER — Encounter: Payer: Self-pay | Admitting: Family Medicine

## 2019-02-04 ENCOUNTER — Other Ambulatory Visit: Payer: Self-pay | Admitting: Family Medicine

## 2019-04-15 ENCOUNTER — Encounter: Payer: Self-pay | Admitting: Family Medicine

## 2019-04-15 NOTE — Telephone Encounter (Signed)
Set up an in person OV  

## 2019-04-24 ENCOUNTER — Ambulatory Visit: Payer: Medicare PPO | Admitting: Family Medicine

## 2019-04-24 ENCOUNTER — Encounter: Payer: Self-pay | Admitting: Family Medicine

## 2019-04-24 ENCOUNTER — Other Ambulatory Visit: Payer: Self-pay

## 2019-04-24 VITALS — BP 140/60 | HR 83 | Temp 97.9°F | Wt 174.8 lb

## 2019-04-24 DIAGNOSIS — M542 Cervicalgia: Secondary | ICD-10-CM

## 2019-04-24 DIAGNOSIS — G8929 Other chronic pain: Secondary | ICD-10-CM | POA: Diagnosis not present

## 2019-04-24 NOTE — Progress Notes (Signed)
   Subjective:    Patient ID: Robin Howe, female    DOB: 1950-11-13, 69 y.o.   MRN: [id number]  HPI Here to discuss worsening neck pain that radiates up the back of the head. This started years ago, but it has gotten worse in the past 3 months. She has trouble turning her from side to side, such as when driving her car. She also has pain when bending her head forward or backward for any length of time. She takes Meloxicam every day. 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 Maryjean Ka for injections and ablations.    Review of Systems  Constitutional: Negative.   Respiratory: Negative.   Cardiovascular: Negative.   Musculoskeletal: Positive for neck pain and neck stiffness.  Neurological: Positive for headaches.       Objective:   Physical Exam Constitutional:      General: She is not in acute distress.    Appearance: Normal appearance.  Cardiovascular:     Rate and Rhythm: Normal rate and regular rhythm.     Pulses: Normal pulses.     Heart sounds: Normal heart sounds.  Pulmonary:     Effort: Pulmonary effort is normal.     Breath sounds: Normal breath sounds.  Musculoskeletal:     Comments: Posterior neck is mildly tender, crepitus is present. ROM is limited by pain  Neurological:     Mental Status: She is alert.           Assessment & Plan:  Neck pain. We will set up a cervical spine MRI soon to evaluate.  [name], MD

## 2019-04-25 ENCOUNTER — Ambulatory Visit: Payer: Medicare Other | Admitting: Family Medicine

## 2019-04-30 ENCOUNTER — Encounter: Payer: Self-pay | Admitting: Family Medicine

## 2019-05-07 ENCOUNTER — Encounter: Payer: Self-pay | Admitting: Family Medicine

## 2019-05-18 ENCOUNTER — Other Ambulatory Visit: Payer: Medicare PPO

## 2019-06-01 ENCOUNTER — Ambulatory Visit
Admission: RE | Admit: 2019-06-01 | Discharge: 2019-06-01 | Disposition: A | Discharge status: Home / Self Care | Payer: Medicare PPO | Source: Ambulatory Visit | Attending: Family Medicine | Admitting: Family Medicine

## 2019-06-01 DIAGNOSIS — M4802 Spinal stenosis, cervical region: Secondary | ICD-10-CM | POA: Diagnosis not present

## 2019-06-01 DIAGNOSIS — G8929 Other chronic pain: Secondary | ICD-10-CM

## 2019-06-10 ENCOUNTER — Other Ambulatory Visit: Payer: Self-pay

## 2019-06-10 ENCOUNTER — Encounter: Payer: Self-pay | Admitting: Family Medicine

## 2019-06-10 ENCOUNTER — Ambulatory Visit (INDEPENDENT_AMBULATORY_CARE_PROVIDER_SITE_OTHER): Payer: Medicare PPO | Admitting: Family Medicine

## 2019-06-10 VITALS — BP 140/70 | HR 83 | Temp 98.0°F | Wt 176.0 lb

## 2019-06-10 DIAGNOSIS — M7661 Achilles tendinitis, right leg: Secondary | ICD-10-CM | POA: Diagnosis not present

## 2019-06-10 MED ORDER — METHYLPREDNISOLONE 4 MG PO TBPK: ORAL_TABLET | ORAL | 0 refills | Status: DC

## 2019-06-10 NOTE — Progress Notes (Signed)
   Subjective:    Patient ID: Robin Howe, female    DOB: 19-Apr-1950, 69 y.o.   MRN: [id number]  HPI Here for 3 weeks of pain in the back of the right heel. No swelling. No recent trauma. Oral Ibuprofen and Voltaren gel have not helped.    Review of Systems  Constitutional: Negative.   Respiratory: Negative.   Cardiovascular: Negative.   Musculoskeletal: Positive for arthralgias.       Objective:   Physical Exam Constitutional:      Appearance: Normal appearance.     Comments: Walks with a limp   Cardiovascular:     Rate and Rhythm: Normal rate and regular rhythm.     Pulses: Normal pulses.     Heart sounds: Normal heart sounds.  Pulmonary:     Effort: Pulmonary effort is normal.     Breath sounds: Normal breath sounds.  Musculoskeletal:     Comments: She is tender over the right posterior heel just over the insertion of th Achilles tendon. No masses or swelling.   Neurological:     Mental Status: She is alert.           Assessment & Plan:  Achilles tendonitis, rest, ice, and a Medrol dose pack. Recheck prn. [name], MD

## 2019-06-10 NOTE — Telephone Encounter (Signed)
Try Voltaren gel (OTC). If that doesn't help , set up an OV

## 2019-06-12 ENCOUNTER — Other Ambulatory Visit: Payer: Self-pay | Admitting: Family Medicine

## 2019-06-12 NOTE — Telephone Encounter (Signed)
Last filled 10/17/2018 Last OV 06/10/2019  Ok to fill?

## 2019-06-17 DIAGNOSIS — M47816 Spondylosis without myelopathy or radiculopathy, lumbar region: Secondary | ICD-10-CM | POA: Diagnosis not present

## 2019-07-29 DIAGNOSIS — M47816 Spondylosis without myelopathy or radiculopathy, lumbar region: Secondary | ICD-10-CM | POA: Diagnosis not present

## 2019-09-20 ENCOUNTER — Encounter: Payer: Self-pay | Admitting: Family Medicine

## 2019-09-20 MED ORDER — MELOXICAM 15 MG PO TABS: 15.0000 mg | ORAL_TABLET | Freq: Every day | ORAL | 3 refills | Status: DC

## 2019-09-20 NOTE — Telephone Encounter (Signed)
Call in Meloxicam 15 mg daily, #90 with 3 rf  

## 2019-11-05 ENCOUNTER — Other Ambulatory Visit: Payer: Self-pay | Admitting: Family Medicine

## 2019-11-18 ENCOUNTER — Other Ambulatory Visit: Payer: Self-pay

## 2019-11-18 ENCOUNTER — Encounter: Payer: Self-pay | Admitting: Family Medicine

## 2019-11-18 ENCOUNTER — Ambulatory Visit (INDEPENDENT_AMBULATORY_CARE_PROVIDER_SITE_OTHER): Payer: Medicare PPO | Admitting: Family Medicine

## 2019-11-18 VITALS — BP 132/70 | HR 83 | Temp 98.1°F | Wt 174.2 lb

## 2019-11-18 DIAGNOSIS — Z0000 Encounter for general adult medical examination without abnormal findings: Secondary | ICD-10-CM

## 2019-11-18 DIAGNOSIS — I10 Essential (primary) hypertension: Secondary | ICD-10-CM | POA: Diagnosis not present

## 2019-11-18 DIAGNOSIS — Z136 Encounter for screening for cardiovascular disorders: Secondary | ICD-10-CM | POA: Diagnosis not present

## 2019-11-18 DIAGNOSIS — Z1231 Encounter for screening mammogram for malignant neoplasm of breast: Secondary | ICD-10-CM | POA: Diagnosis not present

## 2019-11-18 DIAGNOSIS — Z23 Encounter for immunization: Secondary | ICD-10-CM

## 2019-11-18 MED ORDER — TERBINAFINE HCL 250 MG PO TABS: 250.0000 mg | ORAL_TABLET | Freq: Every day | ORAL | 1 refills | Status: DC

## 2019-11-18 NOTE — Addendum Note (Signed)
Addended by: Marrion Coy on: 11/18/2019 01:34 PM   Modules accepted: Orders

## 2019-11-18 NOTE — Progress Notes (Signed)
   Subjective:    Patient ID: Robin Howe, female    DOB: April 08, 1950, 69 y.o.   MRN: [id number]  HPI Here for a well exam. She feels fine but asks about her toenails. They have been thick and curly for years, and they sometimes cause pain. She stopped taking Zoloft earlier this year because the depression doesn't bother her any more.    Review of Systems  Constitutional: Negative.   HENT: Negative.   Eyes: Negative.   Respiratory: Negative.   Cardiovascular: Negative.   Gastrointestinal: Negative.   Genitourinary: Negative for decreased urine volume, difficulty urinating, dyspareunia, dysuria, enuresis, flank pain, frequency, hematuria, pelvic pain and urgency.  Musculoskeletal: Negative.   Skin: Negative.   Neurological: Negative.   Psychiatric/Behavioral: Negative.        Objective:   Physical Exam Constitutional:      General: She is not in acute distress.    Appearance: She is well-developed.  HENT:     Head: Normocephalic and atraumatic.     Right Ear: External ear normal.     Left Ear: External ear normal.     Nose: Nose normal.     Mouth/Throat:     Pharynx: No oropharyngeal exudate.  Eyes:     General: No scleral icterus.    Conjunctiva/sclera: Conjunctivae normal.     Pupils: Pupils are equal, round, and reactive to light.  Neck:     Thyroid: No thyromegaly.     Vascular: No JVD.  Cardiovascular:     Rate and Rhythm: Normal rate and regular rhythm.     Heart sounds: Normal heart sounds. No murmur heard.  No friction rub. No gallop.   Pulmonary:     Effort: Pulmonary effort is normal. No respiratory distress.     Breath sounds: Normal breath sounds. No wheezing or rales.  Chest:     Chest wall: No tenderness.  Abdominal:     General: Bowel sounds are normal. There is no distension.     Palpations: Abdomen is soft. There is no mass.     Tenderness: There is no abdominal tenderness. There is no guarding or rebound.  Musculoskeletal:        General: No  tenderness. Normal range of motion.     Cervical back: Normal range of motion and neck supple.  Lymphadenopathy:     Cervical: No cervical adenopathy.  Skin:    General: Skin is warm and dry.     Findings: No erythema or rash.     Comments: All toenails are yellow and thick   Neurological:     Mental Status: She is alert and oriented to person, place, and time.     Cranial Nerves: No cranial nerve deficit.     Motor: No abnormal muscle tone.     Coordination: Coordination normal.     Deep Tendon Reflexes: Reflexes are normal and symmetric. Reflexes normal.  Psychiatric:        Behavior: Behavior normal.        Thought Content: Thought content normal.        Judgment: Judgment normal.           Assessment & Plan:  Well exam. We discussed diet and exercise. Get fasting labs. Given a pneumococcal vaccine. Try Terbinafine for the onychomycosis.  [name], MD

## 2019-11-18 NOTE — Addendum Note (Signed)
Addended by: Rebecca Eaton on: 11/18/2019 01:34 PM   Modules accepted: Orders

## 2019-11-19 ENCOUNTER — Encounter: Payer: Self-pay | Admitting: Family Medicine

## 2019-11-19 LAB — BASIC METABOLIC PANEL
BUN: 16 mg/dL (ref 7–25)
CO2: 26 mmol/L (ref 20–32)
Calcium: 9.7 mg/dL (ref 8.6–10.4)
Chloride: 101 mmol/L (ref 98–110)
Creat: 0.71 mg/dL (ref 0.50–0.99)
Glucose, Bld: 88 mg/dL (ref 65–99)
Potassium: 4.4 mmol/L (ref 3.5–5.3)
Sodium: 138 mmol/L (ref 135–146)

## 2019-11-19 LAB — HEPATIC FUNCTION PANEL
AG Ratio: 1.5 (calc) (ref 1.0–2.5)
ALT: 14 U/L (ref 6–29)
AST: 26 U/L (ref 10–35)
Albumin: 4.3 g/dL (ref 3.6–5.1)
Alkaline phosphatase (APISO): 134 U/L (ref 37–153)
Bilirubin, Direct: 0.1 mg/dL (ref 0.0–0.2)
Globulin: 2.8 g/dL (calc) (ref 1.9–3.7)
Indirect Bilirubin: 0.4 mg/dL (calc) (ref 0.2–1.2)
Total Bilirubin: 0.5 mg/dL (ref 0.2–1.2)
Total Protein: 7.1 g/dL (ref 6.1–8.1)

## 2019-11-19 LAB — LIPID PANEL
Cholesterol: 159 mg/dL (ref <200)
HDL: 55 mg/dL (ref >=50)
LDL Cholesterol (Calc): 86 mg/dL (calc)
Non-HDL Cholesterol (Calc): 104 mg/dL (calc) (ref <130)
Total CHOL/HDL Ratio: 2.9 (calc) (ref <5.0)
Triglycerides: 86 mg/dL (ref <150)

## 2019-11-19 LAB — CBC WITH DIFFERENTIAL/PLATELET
Absolute Monocytes: 519 cells/uL (ref 200–950)
Basophils Absolute: 137 cells/uL (ref 0–200)
Basophils Relative: 1.5 %
Eosinophils Absolute: 501 cells/uL — ABNORMAL HIGH (ref 15–500)
Eosinophils Relative: 5.5 %
HCT: 38.6 % (ref 35.0–45.0)
Hemoglobin: 12.6 g/dL (ref 11.7–15.5)
Lymphs Abs: 2457 cells/uL (ref 850–3900)
MCH: 28.3 pg (ref 27.0–33.0)
MCHC: 32.6 g/dL (ref 32.0–36.0)
MCV: 86.7 fL (ref 80.0–100.0)
MPV: 10 fL (ref 7.5–12.5)
Monocytes Relative: 5.7 %
Neutro Abs: 5487 cells/uL (ref 1500–7800)
Neutrophils Relative %: 60.3 %
Platelets: 344 10*3/uL (ref 140–400)
RBC: 4.45 10*6/uL (ref 3.80–5.10)
RDW: 13.7 % (ref 11.0–15.0)
Total Lymphocyte: 27 %
WBC: 9.1 10*3/uL (ref 3.8–10.8)

## 2019-11-19 LAB — TSH: TSH: 3.47 mIU/L (ref 0.40–4.50)

## 2019-11-19 NOTE — Telephone Encounter (Signed)
This is in regards to his CBC. Please advise.

## 2019-11-22 ENCOUNTER — Encounter: Payer: Self-pay | Admitting: Family Medicine

## 2019-11-25 NOTE — Telephone Encounter (Signed)
This is nothing to worry about. This level goes up if someone's allergies are acting up

## 2019-12-13 ENCOUNTER — Encounter: Payer: Self-pay | Admitting: Family Medicine

## 2019-12-13 MED ORDER — CIPROFLOXACIN HCL 500 MG PO TABS: 500.0000 mg | ORAL_TABLET | Freq: Two times a day (BID) | ORAL | 0 refills | Status: DC

## 2019-12-13 NOTE — Telephone Encounter (Signed)
Call in Cipro 500 mg BID for 7 days  

## 2019-12-19 ENCOUNTER — Ambulatory Visit (INDEPENDENT_AMBULATORY_CARE_PROVIDER_SITE_OTHER): Payer: Medicare PPO

## 2019-12-19 ENCOUNTER — Other Ambulatory Visit: Payer: Self-pay

## 2019-12-19 DIAGNOSIS — Z0000 Encounter for general adult medical examination without abnormal findings: Secondary | ICD-10-CM | POA: Diagnosis not present

## 2019-12-19 NOTE — Patient Instructions (Signed)
Robin Howe , Thank you for taking time to come for your Medicare Wellness Visit. I appreciate your ongoing commitment to your health goals. Please review the following plan we discussed and let me know if I can assist you in the future.   Screening recommendations/referrals: Colonoscopy: Up to date, next due 05/12/2020 Mammogram: Up to date, next due 11/18/2019 Bone Density: Currently due, orders placed this visit Recommended yearly ophthalmology/optometry visit for glaucoma screening and checkup Recommended yearly dental visit for hygiene and checkup  Vaccinations: Influenza vaccine: Currently due for flu vaccine for 2021-2022 Pneumococcal vaccine: Completed series Tdap vaccine: Up to date, next due 11/23/2023 Shingles vaccine: Currently due for Shingrix, if you would like to receive please contact your local pharmacy.    Advanced directives: Please pick up and updated copy of Advanced Directive paperwork at your next office visit  Conditions/risks identified: none   Next appointment: 12/18/2020 @ 2:45 PM   Preventive Care 65 Years and Older, Female Preventive care refers to lifestyle choices and visits with your health care provider that can promote health and wellness. What does preventive care include?  A yearly physical exam. This is also called an annual well check.  Dental exams once or twice a year.  Routine eye exams. Ask your health care provider how often you should have your eyes checked.  Personal lifestyle choices, including:  Daily care of your teeth and gums.  Regular physical activity.  Eating a healthy diet.  Avoiding tobacco and drug use.  Limiting alcohol use.  Practicing safe sex.  Taking low-dose aspirin every day.  Taking vitamin and mineral supplements as recommended by your health care provider. What happens during an annual well check? The services and screenings done by your health care provider during your annual well check will depend on  your age, overall health, lifestyle risk factors, and family history of disease. Counseling  Your health care provider may ask you questions about your:  Alcohol use.  Tobacco use.  Drug use.  Emotional well-being.  Home and relationship well-being.  Sexual activity.  Eating habits.  History of falls.  Memory and ability to understand (cognition).  Work and work Statistician.  Reproductive health. Screening  You may have the following tests or measurements:  Height, weight, and BMI.  Blood pressure.  Lipid and cholesterol levels. These may be checked every 5 years, or more frequently if you are over 67 years old.  Skin check.  Lung cancer screening. You may have this screening every year starting at age 78 if you have a 30-pack-year history of smoking and currently smoke or have quit within the past 15 years.  Fecal occult blood test (FOBT) of the stool. You may have this test every year starting at age 51.  Flexible sigmoidoscopy or colonoscopy. You may have a sigmoidoscopy every 5 years or a colonoscopy every 10 years starting at age 3.  Hepatitis C blood test.  Hepatitis B blood test.  Sexually transmitted disease (STD) testing.  Diabetes screening. This is done by checking your blood sugar (glucose) after you have not eaten for a while (fasting). You may have this done every 1-3 years.  Bone density scan. This is done to screen for osteoporosis. You may have this done starting at age 66.  Mammogram. This may be done every 1-2 years. Talk to your health care provider about how often you should have regular mammograms. Talk with your health care provider about your test results, treatment options, and if necessary, the  need for more tests. Vaccines  Your health care provider may recommend certain vaccines, such as:  Influenza vaccine. This is recommended every year.  Tetanus, diphtheria, and acellular pertussis (Tdap, Td) vaccine. You may need a Td booster  every 10 years.  Zoster vaccine. You may need this after age 2.  Pneumococcal 13-valent conjugate (PCV13) vaccine. One dose is recommended after age 49.  Pneumococcal polysaccharide (PPSV23) vaccine. One dose is recommended after age 64. Talk to your health care provider about which screenings and vaccines you need and how often you need them. This information is not intended to replace advice given to you by your health care provider. Make sure you discuss any questions you have with your health care provider. Document Released: 04/17/2015 Document Revised: 12/09/2015 Document Reviewed: 01/20/2015 Elsevier Interactive Patient Education  2017 [place] Prevention in the Home Falls can cause injuries. They can happen to people of all ages. There are many things you can do to make your home safe and to help prevent falls. What can I do on the outside of my home?  Regularly fix the edges of walkways and driveways and fix any cracks.  Remove anything that might make you trip as you walk through a door, such as a raised step or threshold.  Trim any bushes or trees on the path to your home.  Use bright outdoor lighting.  Clear any walking paths of anything that might make someone trip, such as rocks or tools.  Regularly check to see if handrails are loose or broken. Make sure that both sides of any steps have handrails.  Any raised decks and porches should have guardrails on the edges.  Have any leaves, snow, or ice cleared regularly.  Use sand or salt on walking paths during winter.  Clean up any spills in your garage right away. This includes oil or grease spills. What can I do in the bathroom?  Use night lights.  Install grab bars by the toilet and in the tub and shower. Do not use towel bars as grab bars.  Use non-skid mats or decals in the tub or shower.  If you need to sit down in the shower, use a plastic, non-slip stool.  Keep the floor dry. Clean up any  water that spills on the floor as soon as it happens.  Remove soap buildup in the tub or shower regularly.  Attach bath mats securely with double-sided non-slip rug tape.  Do not have throw rugs and other things on the floor that can make you trip. What can I do in the bedroom?  Use night lights.  Make sure that you have a light by your bed that is easy to reach.  Do not use any sheets or blankets that are too big for your bed. They should not hang down onto the floor.  Have a firm chair that has side arms. You can use this for support while you get dressed.  Do not have throw rugs and other things on the floor that can make you trip. What can I do in the kitchen?  Clean up any spills right away.  Avoid walking on wet floors.  Keep items that you use a lot in easy-to-reach places.  If you need to reach something above you, use a strong step stool that has a grab bar.  Keep electrical cords out of the way.  Do not use floor polish or wax that makes floors slippery. If you must use wax,  use non-skid floor wax.  Do not have throw rugs and other things on the floor that can make you trip. What can I do with my stairs?  Do not leave any items on the stairs.  Make sure that there are handrails on both sides of the stairs and use them. Fix handrails that are broken or loose. Make sure that handrails are as long as the stairways.  Check any carpeting to make sure that it is firmly attached to the stairs. Fix any carpet that is loose or worn.  Avoid having throw rugs at the top or bottom of the stairs. If you do have throw rugs, attach them to the floor with carpet tape.  Make sure that you have a light switch at the top of the stairs and the bottom of the stairs. If you do not have them, ask someone to add them for you. What else can I do to help prevent falls?  Wear shoes that:  Do not have high heels.  Have rubber bottoms.  Are comfortable and fit you well.  Are closed  at the toe. Do not wear sandals.  If you use a stepladder:  Make sure that it is fully opened. Do not climb a closed stepladder.  Make sure that both sides of the stepladder are locked into place.  Ask someone to hold it for you, if possible.  Clearly mark and make sure that you can see:  Any grab bars or handrails.  First and last steps.  Where the edge of each step is.  Use tools that help you move around (mobility aids) if they are needed. These include:  Canes.  Walkers.  Scooters.  Crutches.  Turn on the lights when you go into a dark area. Replace any light bulbs as soon as they burn out.  Set up your furniture so you have a clear path. Avoid moving your furniture around.  If any of your floors are uneven, fix them.  If there are any pets around you, be aware of where they are.  Review your medicines with your doctor. Some medicines can make you feel dizzy. This can increase your chance of falling. Ask your doctor what other things that you can do to help prevent falls. This information is not intended to replace advice given to you by your health care provider. Make sure you discuss any questions you have with your health care provider. Document Released: 01/15/2009 Document Revised: 08/27/2015 Document Reviewed: 04/25/2014 Elsevier Interactive Patient Education  2017 [employer].

## 2019-12-19 NOTE — Progress Notes (Signed)
Subjective:   Robin Howe is a 69 y.o. female who presents for an Initial Medicare Annual Wellness Visit.  I connected with Anuoluwapo Mefferd today by telephone and verified that I am speaking with the correct person using two identifiers. Location patient: home Location provider: work Persons participating in the virtual visit: patient, provider.   I discussed the limitations, risks, security and privacy concerns of performing an evaluation and management service by telephone and the availability of in person appointments. I also discussed with the patient that there may be a patient responsible charge related to this service. The patient expressed understanding and verbally consented to this telephonic visit.    Interactive audio and video telecommunications were attempted between this provider and patient, however failed, due to patient having technical difficulties OR patient did not have access to video capability.  We continued and completed visit with audio only.     Review of Systems    N/A Cardiac Risk Factors include: advanced age (>2men, >86 women);dyslipidemia     Objective:    Today's Vitals   There is no height or weight on file to calculate BMI.  Advanced Directives 12/19/2019 04/01/2015  Does Patient Have a Medical Advance Directive? Yes Yes  Type of Paramedic of [place];Living will [place];Living will  Does patient want to make changes to medical advance directive? Yes (MAU/Ambulatory/Procedural Areas - Information given) No - Patient declined  Copy of [place] in Chart? No - copy requested No - copy requested    Current Medications (verified) Outpatient Encounter Medications as of 12/19/2019  Medication Sig  . ALPRAZolam (XANAX) 0.5 MG tablet TAKE 1 TABLET (0.5 MG TOTAL) BY MOUTH AT BEDTIME AS NEEDED FOR SLEEP.  Marland Kitchen amLODipine (NORVASC) 5 MG tablet TAKE 1 TABLET BY MOUTH EVERY DAY  . aspirin 81  MG tablet Take 81 mg by mouth daily.  Marland Kitchen atorvastatin (LIPITOR) 20 MG tablet TAKE 1 TABLET BY MOUTH EVERY DAY  . ciprofloxacin (CIPRO) 500 MG tablet Take 1 tablet (500 mg total) by mouth 2 (two) times daily.  . meloxicam (MOBIC) 15 MG tablet Take 1 tablet (15 mg total) by mouth daily.  Marland Kitchen terbinafine (LAMISIL) 250 MG tablet Take 1 tablet (250 mg total) by mouth daily.   No facility-administered encounter medications on file as of 12/19/2019.    Allergies (verified) Bextra [valdecoxib] and Lisinopril   History: Past Medical History:  Diagnosis Date  . Arthritis   . Colon polyps   . Depression   . GERD (gastroesophageal reflux disease)   . Hyperlipidemia   . Hypertension   . Irritable bowel syndrome (IBS)   . Kidney stones    sees Dr. Hessie Diener   . Multiple lung nodules    both lung bases, appear benign, next scheduled CT will be march 2013   Past Surgical History:  Procedure Laterality Date  . BREAST REDUCTION SURGERY     bilateral   . CARPAL TUNNEL RELEASE     bilateral, per Dr. Cyndy Freeze  . CATARACT EXTRACTION, BILATERAL Bilateral 2018  . CESAREAN SECTION    . CHOLECYSTECTOMY    . COLONOSCOPY  05-13-15    per Dr. Fuller Plan, adenomatous polyp, repeat in 5 yrs   . LUMBAR FUSION  2006   per Dr. Christella Noa  . LUMBAR LAMINECTOMY     Family History  Problem Relation Age of Onset  . Colon cancer Mother        40  Social History   Socioeconomic History  . Marital status: Widowed    Spouse name: Not on file  . Number of children: Not on file  . Years of education: Not on file  . Highest education level: Not on file  Occupational History  . Not on file  Tobacco Use  . Smoking status: Never Smoker  . Smokeless tobacco: Never Used  Substance and Sexual Activity  . Alcohol use: Yes    Alcohol/week: 0.0 standard drinks    Comment: rare  . Drug use: No  . Sexual activity: Not on file  Other Topics Concern  . Not on file  Social History Narrative  . Not on file    Social Determinants of Health   Financial Resource Strain: Low Risk   . Difficulty of Paying Living Expenses: Not hard at all  Food Insecurity: No Food Insecurity  . Worried About Charity fundraiser in the Last Year: Never true  . Ran Out of Food in the Last Year: Never true  Transportation Needs: No Transportation Needs  . Lack of Transportation (Medical): No  . Lack of Transportation (Non-Medical): No  Physical Activity: Sufficiently Active  . Days of Exercise per Week: 7 days  . Minutes of Exercise per Session: 30 min  Stress: No Stress Concern Present  . Feeling of Stress : Not at all  Social Connections: Moderately Integrated  . Frequency of Communication with Friends and Family: More than three times a week  . Frequency of Social Gatherings with Friends and Family: More than three times a week  . Attends Religious Services: More than 4 times per year  . Active Member of Clubs or Organizations: Yes  . Attends Archivist Meetings: More than 4 times per year  . Marital Status: Widowed    Tobacco Counseling Counseling given: Not Answered   Clinical Intake:  Pre-visit preparation completed: Yes  Pain : No/denies pain     Nutritional Risks: None Diabetes: No  How often do you need to have someone help you when you read instructions, pamphlets, or other written materials from your doctor or pharmacy?: 1 - Never What is the last grade level you completed in school?: Bachelors Degree  Diabetic?No  Interpreter Needed?: No  Information entered by :: [place] of Daily Living In your present state of health, do you have any difficulty performing the following activities: 12/19/2019  Hearing? N  Vision? N  Difficulty concentrating or making decisions? N  Walking or climbing stairs? N  Dressing or bathing? N  Doing errands, shopping? N  Preparing Food and eating ? N  Using the Toilet? N  In the past six months, have you accidently leaked  urine? N  Do you have problems with loss of bowel control? N  Managing your Medications? N  Managing your Finances? N  Housekeeping or managing your Housekeeping? N  Some recent data might be hidden    Patient Care Team: Laurey Morale, MD as PCP - General  Indicate any recent Medical Services you may have received from other than [facility] providers in the past year (date may be approximate).     Assessment:   This is a routine wellness examination for Annet.  Hearing/Vision screen  Hearing Screening   125Hz  250Hz  500Hz  1000Hz  2000Hz  3000Hz  4000Hz  6000Hz  8000Hz   Right ear:           Left ear:           Vision Screening Comments:  Patient states gets eyes checked annually has not had eye exam since COVID   Dietary issues and exercise activities discussed: Current Exercise Habits: Home exercise routine, Type of exercise: walking, Time (Minutes): 30, Frequency (Times/Week): 7, Weekly Exercise (Minutes/Week): 210, Intensity: Mild, Exercise limited by: None identified  Goals    . Patient Stated     I will continue to walk 30 minutes every morning!      Depression Screen PHQ 2/9 Scores 12/19/2019 08/30/2017 07/15/2016  PHQ - 2 Score 0 0 0  PHQ- 9 Score 0 - -    Fall Risk Fall Risk  12/19/2019 08/30/2017 07/15/2016  Falls in the past year? 0 No No  Number falls in past yr: 0 - -  Injury with Fall? 0 - -  Risk for fall due to : No Fall Risks - -  Follow up Falls evaluation completed;Falls prevention discussed - -    Any stairs in or around the home? Yes  If so, are there any without handrails? No  Home free of loose throw rugs in walkways, pet beds, electrical cords, etc? Yes  Adequate lighting in your home to reduce risk of falls? Yes   ASSISTIVE DEVICES UTILIZED TO PREVENT FALLS:  Life alert? No  Use of a cane, walker or w/c? No  Grab bars in the bathroom? Yes  Shower chair or bench in shower? No  Elevated toilet seat or a handicapped toilet? No     Cognitive  Function:  Cognitive screening not indicated based on direct observation      Immunizations Immunization History  Administered Date(s) Administered  . Influenza Split 03/17/2011, 01/26/2012  . Influenza Whole 02/21/2007, 01/10/2008, 01/12/2009, 01/13/2010  . Influenza, High Dose Seasonal PF 01/06/2017, 12/08/2017  . Influenza,inj,Quad PF,6+ Mos 01/25/2013, 11/22/2013, 12/19/2014  . Influenza-Unspecified 12/30/2015, 01/06/2017, 11/10/2018  . PFIZER SARS-COV-2 Vaccination 05/14/2019, 06/11/2019  . PPD Test 11/22/2013  . Pneumococcal Conjugate-13 11/08/2018  . Pneumococcal Polysaccharide-23 11/18/2019  . Tdap 11/22/2013    TDAP status: Up to date Flu Vaccine status: Up to date Pneumococcal vaccine status: Up to date Covid-19 vaccine status: Completed vaccines  Qualifies for Shingles Vaccine? Yes   Zostavax completed No   Shingrix Completed?: No.    Education has been provided regarding the importance of this vaccine. Patient has been advised to call insurance company to determine out of pocket expense if they have not yet received this vaccine. Advised may also receive vaccine at local pharmacy or Health Dept. Verbalized acceptance and understanding.  Screening Tests Health Maintenance  Topic Date Due  . Hepatitis C Screening  Never done  . INFLUENZA VACCINE  11/03/2019  . COLONOSCOPY  05/12/2020  . MAMMOGRAM  11/17/2021  . TETANUS/TDAP  11/23/2023  . DEXA SCAN  Completed  . COVID-19 Vaccine  Completed  . PNA vac Low Risk Adult  Completed    Health Maintenance  Health Maintenance Due  Topic Date Due  . Hepatitis C Screening  Never done  . INFLUENZA VACCINE  11/03/2019    Colorectal cancer screening: Completed 05/13/2015. Repeat every 5 years Mammogram status: Completed 11/18/2019. Repeat every year Bone Density status: Completed 09/05/2017. Results reflect: Bone density results: OSTEOPENIA. Repeat every 2 years.  Lung Cancer Screening: (Low Dose CT Chest recommended  if Age 69-80 years, 30 pack-year currently smoking OR have quit w/in 15years.) does not qualify.   Lung Cancer Screening Referral: N/A  Additional Screening:  Hepatitis C Screening: does qualify;   Vision Screening: Recommended annual ophthalmology exams for  early detection of glaucoma and other disorders of the eye. Is the patient up to date with their annual eye exam?  No  Who is the provider or what is the name of the office in which the patient attends annual eye exams? [facility]  If pt is not established with a provider, would they like to be referred to a provider to establish care? No .   Dental Screening: Recommended annual dental exams for proper oral hygiene  Community Resource Referral / Chronic Care Management: CRR required this visit?  No   CCM required this visit?  No      Plan:     I have personally reviewed and noted the following in the patient's chart:   . Medical and social history . Use of alcohol, tobacco or illicit drugs  . Current medications and supplements . Functional ability and status . Nutritional status . Physical activity . Advanced directives . List of other physicians . Hospitalizations, surgeries, and ER visits in previous 12 months . Vitals . Screenings to include cognitive, depression, and falls . Referrals and appointments  In addition, I have reviewed and discussed with patient certain preventive protocols, quality metrics, and best practice recommendations. A written personalized care plan for preventive services as well as general preventive health recommendations were provided to patient.     Ofilia Neas, LPN   [id number]/81/7711   Nurse Notes: None

## 2019-12-30 ENCOUNTER — Encounter: Payer: Self-pay | Admitting: Family Medicine

## 2020-01-02 ENCOUNTER — Encounter: Payer: Self-pay | Admitting: Family Medicine

## 2020-01-03 NOTE — Telephone Encounter (Signed)
Yes I think she should get a booster, but we do not have then here. She can probably get this from her pharmacy

## 2020-02-13 ENCOUNTER — Other Ambulatory Visit: Payer: Self-pay | Admitting: Family Medicine

## 2020-02-13 NOTE — Telephone Encounter (Signed)
Received a refill request for: Alprazolam 0.5 mg LR 06/12/19, #30, 5 rfs LOV 11/18/19 FOV 12/18/20  Please review and advise.   Thanks. Dm/cma

## 2020-03-11 ENCOUNTER — Encounter: Payer: Self-pay | Admitting: Family Medicine

## 2020-05-05 ENCOUNTER — Other Ambulatory Visit: Payer: Self-pay | Admitting: Family Medicine

## 2020-07-09 ENCOUNTER — Encounter: Payer: Self-pay | Admitting: Family Medicine

## 2020-07-10 NOTE — Telephone Encounter (Signed)
Yes I am recommending that everyone over 50 get the extra booster

## 2020-08-17 ENCOUNTER — Encounter: Payer: Self-pay | Admitting: Family Medicine

## 2020-08-18 NOTE — Telephone Encounter (Signed)
Spoke with pt advised that since she is not diabetic she will need to contact her insurance for more advise on podiatry referral coverage. Pt verbalized understanding

## 2020-10-15 ENCOUNTER — Encounter: Payer: Self-pay | Admitting: Gastroenterology

## 2020-10-28 ENCOUNTER — Encounter: Payer: Self-pay | Admitting: Family Medicine

## 2020-10-28 NOTE — Telephone Encounter (Signed)
Yes this is the source of the pain that radiates into her arms

## 2020-11-04 ENCOUNTER — Other Ambulatory Visit: Payer: Self-pay | Admitting: Family Medicine

## 2020-11-05 ENCOUNTER — Other Ambulatory Visit: Payer: Self-pay | Admitting: Family Medicine

## 2020-11-05 NOTE — Telephone Encounter (Signed)
Last office visit 11/18/19 Upcoming appt 12/02/20

## 2020-11-09 DIAGNOSIS — M542 Cervicalgia: Secondary | ICD-10-CM | POA: Diagnosis not present

## 2020-11-09 DIAGNOSIS — M47816 Spondylosis without myelopathy or radiculopathy, lumbar region: Secondary | ICD-10-CM | POA: Diagnosis not present

## 2020-11-11 ENCOUNTER — Encounter: Payer: Self-pay | Admitting: Family Medicine

## 2020-11-11 NOTE — Telephone Encounter (Signed)
This is not a pain medication exactly. It is used to calm down irritated nerve endings, which can help control pain. She may need to take it 2-3 weeks to get the full effect

## 2020-11-23 DIAGNOSIS — Z803 Family history of malignant neoplasm of breast: Secondary | ICD-10-CM | POA: Diagnosis not present

## 2020-11-23 DIAGNOSIS — Z1231 Encounter for screening mammogram for malignant neoplasm of breast: Secondary | ICD-10-CM | POA: Diagnosis not present

## 2020-12-01 ENCOUNTER — Other Ambulatory Visit: Payer: Self-pay

## 2020-12-02 ENCOUNTER — Encounter: Payer: Self-pay | Admitting: Family Medicine

## 2020-12-02 ENCOUNTER — Ambulatory Visit (INDEPENDENT_AMBULATORY_CARE_PROVIDER_SITE_OTHER): Payer: Medicare PPO | Admitting: Family Medicine

## 2020-12-02 VITALS — BP 110/76 | HR 68 | Temp 98.5°F | Ht <= 58 in | Wt 171.0 lb

## 2020-12-02 DIAGNOSIS — Z0000 Encounter for general adult medical examination without abnormal findings: Secondary | ICD-10-CM | POA: Diagnosis not present

## 2020-12-02 LAB — LIPID PANEL
Cholesterol: 180 mg/dL (ref 0–200)
HDL: 58.2 mg/dL (ref >39.00)
LDL Cholesterol: 106 mg/dL — ABNORMAL HIGH (ref 0–99)
NonHDL: 122.16
Total CHOL/HDL Ratio: 3
Triglycerides: 81 mg/dL (ref 0.0–149.0)
VLDL: 16.2 mg/dL (ref 0.0–40.0)

## 2020-12-02 LAB — HEPATIC FUNCTION PANEL
ALT: 14 U/L (ref 0–35)
AST: 24 U/L (ref 0–37)
Albumin: 4.5 g/dL (ref 3.5–5.2)
Alkaline Phosphatase: 116 U/L (ref 39–117)
Bilirubin, Direct: 0.1 mg/dL (ref 0.0–0.3)
Total Bilirubin: 0.4 mg/dL (ref 0.2–1.2)
Total Protein: 7.8 g/dL (ref 6.0–8.3)

## 2020-12-02 LAB — CBC WITH DIFFERENTIAL/PLATELET
Basophils Absolute: 0.1 10*3/uL (ref 0.0–0.1)
Basophils Relative: 1.3 % (ref 0.0–3.0)
Eosinophils Absolute: 0.4 10*3/uL (ref 0.0–0.7)
Eosinophils Relative: 5.6 % — ABNORMAL HIGH (ref 0.0–5.0)
HCT: 41.1 % (ref 36.0–46.0)
Hemoglobin: 13.9 g/dL (ref 12.0–15.0)
Lymphocytes Relative: 23 % (ref 12.0–46.0)
Lymphs Abs: 1.6 10*3/uL (ref 0.7–4.0)
MCHC: 33.9 g/dL (ref 30.0–36.0)
MCV: 88 fl (ref 78.0–100.0)
Monocytes Absolute: 0.4 10*3/uL (ref 0.1–1.0)
Monocytes Relative: 6.2 % (ref 3.0–12.0)
Neutro Abs: 4.5 10*3/uL (ref 1.4–7.7)
Neutrophils Relative %: 63.9 % (ref 43.0–77.0)
Platelets: 295 10*3/uL (ref 150.0–400.0)
RBC: 4.67 Mil/uL (ref 3.87–5.11)
RDW: 15.4 % (ref 11.5–15.5)
WBC: 7.1 10*3/uL (ref 4.0–10.5)

## 2020-12-02 LAB — BASIC METABOLIC PANEL
BUN: 15 mg/dL (ref 6–23)
CO2: 24 mEq/L (ref 19–32)
Calcium: 10.1 mg/dL (ref 8.4–10.5)
Chloride: 105 mEq/L (ref 96–112)
Creatinine, Ser: 0.84 mg/dL (ref 0.40–1.20)
GFR: 70.76 mL/min (ref >60.00)
Glucose, Bld: 87 mg/dL (ref 70–99)
Potassium: 3.9 mEq/L (ref 3.5–5.1)
Sodium: 140 mEq/L (ref 135–145)

## 2020-12-02 LAB — HEMOGLOBIN A1C: Hgb A1c MFr Bld: 5.3 % (ref 4.6–6.5)

## 2020-12-02 LAB — TSH: TSH: 4.49 u[IU]/mL (ref 0.35–5.50)

## 2020-12-02 MED ORDER — MELOXICAM 15 MG PO TABS
15.0000 mg | ORAL_TABLET | Freq: Every day | ORAL | 3 refills | Status: DC
Start: 2020-12-02 — End: 2021-10-11

## 2020-12-02 NOTE — Progress Notes (Signed)
   Subjective:    Patient ID: Robin Howe, female    DOB: Dec 12, 1950, 70 y.o.   MRN: [id number]  HPI Here for a well exam. She feels well with the exception of having chronic neck pain which leads to tension headaches. She sees Justine Null NP at [facility], and she was recently started on Topamax to treat this. It this does not work, they plan to try epidural steroid shots to the cervical spine. She was due for another colonoscopy last month.    Review of Systems  Constitutional: Negative.   HENT: Negative.    Eyes: Negative.   Respiratory: Negative.    Cardiovascular: Negative.   Gastrointestinal: Negative.   Genitourinary:  Negative for decreased urine volume, difficulty urinating, dyspareunia, dysuria, enuresis, flank pain, frequency, hematuria, pelvic pain and urgency.  Musculoskeletal:  Positive for neck pain.  Skin: Negative.   Neurological:  Positive for headaches.  Psychiatric/Behavioral: Negative.        Objective:   Physical Exam Constitutional:      General: She is not in acute distress.    Appearance: Normal appearance. She is well-developed.  HENT:     Head: Normocephalic and atraumatic.     Right Ear: External ear normal.     Left Ear: External ear normal.     Nose: Nose normal.     Mouth/Throat:     Pharynx: No oropharyngeal exudate.  Eyes:     General: No scleral icterus.    Conjunctiva/sclera: Conjunctivae normal.     Pupils: Pupils are equal, round, and reactive to light.  Neck:     Thyroid: No thyromegaly.     Vascular: No JVD.  Cardiovascular:     Rate and Rhythm: Normal rate and regular rhythm.     Heart sounds: Normal heart sounds. No murmur heard.   No friction rub. No gallop.  Pulmonary:     Effort: Pulmonary effort is normal. No respiratory distress.     Breath sounds: Normal breath sounds. No wheezing or rales.  Chest:     Chest wall: No tenderness.  Abdominal:     General: Bowel sounds are normal. There is no distension.      Palpations: Abdomen is soft. There is no mass.     Tenderness: There is no abdominal tenderness. There is no guarding or rebound.  Musculoskeletal:        General: No tenderness. Normal range of motion.     Cervical back: Normal range of motion and neck supple.  Lymphadenopathy:     Cervical: No cervical adenopathy.  Skin:    General: Skin is warm and dry.     Findings: No erythema or rash.  Neurological:     Mental Status: She is alert and oriented to person, place, and time.     Cranial Nerves: No cranial nerve deficit.     Motor: No abnormal muscle tone.     Coordination: Coordination normal.     Deep Tendon Reflexes: Reflexes are normal and symmetric. Reflexes normal.  Psychiatric:        Behavior: Behavior normal.        Thought Content: Thought content normal.        Judgment: Judgment normal.          Assessment & Plan:  Well exam. We discussed diet and exercise. Get fasting labs. I advised her to set up the colonoscopy, and she agreed.  [name], MD

## 2020-12-04 ENCOUNTER — Encounter: Payer: Self-pay | Admitting: Family Medicine

## 2020-12-08 NOTE — Telephone Encounter (Signed)
I will wait for these records to arrive

## 2020-12-11 ENCOUNTER — Encounter: Payer: Self-pay | Admitting: Family Medicine

## 2020-12-14 NOTE — Telephone Encounter (Signed)
There is a brief summary note ion her chart, but I do not have the full office note yet

## 2020-12-15 ENCOUNTER — Encounter: Payer: Self-pay | Admitting: Family Medicine

## 2020-12-16 ENCOUNTER — Encounter: Payer: Self-pay | Admitting: Gastroenterology

## 2020-12-18 ENCOUNTER — Ambulatory Visit: Payer: Medicare PPO

## 2020-12-22 ENCOUNTER — Encounter: Payer: Self-pay | Admitting: Family Medicine

## 2020-12-22 DIAGNOSIS — Z6835 Body mass index (BMI) 35.0-35.9, adult: Secondary | ICD-10-CM | POA: Diagnosis not present

## 2020-12-22 DIAGNOSIS — I10 Essential (primary) hypertension: Secondary | ICD-10-CM | POA: Diagnosis not present

## 2020-12-23 NOTE — Telephone Encounter (Signed)
I am sorry to hear about your sister. As for the headaches, they could in fact be migraines. I would try the Gabapentin. Once these are under control, she can probable stop the medication

## 2020-12-28 ENCOUNTER — Other Ambulatory Visit: Payer: Self-pay | Admitting: Family Medicine

## 2020-12-30 ENCOUNTER — Ambulatory Visit: Payer: Medicare PPO

## 2020-12-30 ENCOUNTER — Other Ambulatory Visit: Payer: Self-pay

## 2020-12-30 NOTE — Progress Notes (Deleted)
Virtual Visit via Telephone Note  I connected with  Robin Howe on 12/30/20 at 11:45 AM EDT by telephone and verified that I am speaking with the correct person using two identifiers.  Medicare Annual Wellness visit completed telephonically due to Covid-19 pandemic.   Persons participating in this call: This Health Coach and this patient.   Location: Patient: Home Provider: Office   I discussed the limitations, risks, security and privacy concerns of performing an evaluation and management service by telephone and the availability of in person appointments. The patient expressed understanding and agreed to proceed.  Unable to perform video visit due to video visit attempted and failed and/or patient does not have video capability.   Some vital signs may be absent or patient reported.   Willette Brace, LPN

## 2021-01-04 ENCOUNTER — Other Ambulatory Visit: Payer: Self-pay

## 2021-01-04 ENCOUNTER — Ambulatory Visit: Payer: Medicare PPO | Admitting: Family Medicine

## 2021-01-04 ENCOUNTER — Encounter: Payer: Self-pay | Admitting: Family Medicine

## 2021-01-04 VITALS — BP 120/76 | HR 80 | Temp 98.0°F | Wt 176.0 lb

## 2021-01-04 DIAGNOSIS — G8929 Other chronic pain: Secondary | ICD-10-CM

## 2021-01-04 DIAGNOSIS — R519 Headache, unspecified: Secondary | ICD-10-CM | POA: Diagnosis not present

## 2021-01-04 DIAGNOSIS — M542 Cervicalgia: Secondary | ICD-10-CM

## 2021-01-04 NOTE — Progress Notes (Signed)
   Subjective:    Patient ID: Robin Howe, female    DOB: February 19, 1951, 70 y.o.   MRN: [id number]  HPI Here for advice about her chronic neck pain and chronic headaches. 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 She has been seeing Simeon Craft PA at [facility] for this, and the headaches are felt to be cervicogenic. She is taking  Meloxicam and Gabapentin, but nothing has really helped. She asks if she can get a second opinion about this.    Review of Systems  Constitutional: Negative.   Respiratory: Negative.    Cardiovascular: Negative.   Musculoskeletal:  Positive for neck pain and neck stiffness.  Neurological:  Positive for headaches.      Objective:   Physical Exam Constitutional:      Appearance: Normal appearance.  Cardiovascular:     Rate and Rhythm: Normal rate and regular rhythm.     Pulses: Normal pulses.     Heart sounds: Normal heart sounds.  Pulmonary:     Effort: Pulmonary effort is normal.     Breath sounds: Normal breath sounds.  Musculoskeletal:     Comments: The neck is mildly tender and has reduced ROM   Neurological:     General: No focal deficit present.     Mental Status: She is alert and oriented to person, place, and time.          Assessment & Plan:  Chronic neck pain and cervicogenic headaches. We will refer her to Dr. Verlin Dike for a second opinion.  [name], MD

## 2021-01-20 ENCOUNTER — Other Ambulatory Visit: Payer: Self-pay

## 2021-01-20 ENCOUNTER — Ambulatory Visit (INDEPENDENT_AMBULATORY_CARE_PROVIDER_SITE_OTHER): Payer: Medicare PPO

## 2021-01-20 DIAGNOSIS — Z0000 Encounter for general adult medical examination without abnormal findings: Secondary | ICD-10-CM

## 2021-01-20 NOTE — Progress Notes (Signed)
Virtual Visit via Telephone Note  I connected with  Robin Howe on 01/20/21 at  8:45 AM EDT by telephone and verified that I am speaking with the correct person using two identifiers.  Medicare Annual Wellness visit completed telephonically due to Covid-19 pandemic.   Persons participating in this call: This Health Coach and this patient.   Location: Patient: Home Provider: Office   I discussed the limitations, risks, security and privacy concerns of performing an evaluation and management service by telephone and the availability of in person appointments. The patient expressed understanding and agreed to proceed.  Unable to perform video visit due to video visit attempted and failed and/or patient does not have video capability.   Some vital signs may be absent or patient reported.   Willette Brace, LPN   Subjective:   Robin MARTER is a 70 y.o. female who presents for Medicare Annual (Subsequent) preventive examination.  Review of Systems           Objective:    There were no vitals filed for this visit. There is no height or weight on file to calculate BMI.  Advanced Directives 01/20/2021 12/19/2019 04/01/2015  Does Patient Have a Medical Advance Directive? Yes Yes Yes  Type of Paramedic of [place];Living will [place];Living will  Does patient want to make changes to medical advance directive? - Yes (MAU/Ambulatory/Procedural Areas - Information given) No - Patient declined  Copy of [place] in Chart? No - copy requested No - copy requested No - copy requested    Current Medications (verified) Outpatient Encounter Medications as of 01/20/2021  Medication Sig   ALPRAZolam (XANAX) 0.5 MG tablet TAKE 1 TABLET (0.5 MG TOTAL) BY MOUTH AT BEDTIME AS NEEDED FOR SLEEP.   amLODipine (NORVASC) 5 MG tablet TAKE 1 TABLET BY MOUTH EVERY DAY   atorvastatin (LIPITOR) 20 MG tablet  TAKE 1 TABLET BY MOUTH EVERY DAY   gabapentin (NEURONTIN) 100 MG capsule Take 100 mg by mouth 3 (three) times daily.   meloxicam (MOBIC) 15 MG tablet Take 1 tablet (15 mg total) by mouth daily.   No facility-administered encounter medications on file as of 01/20/2021.    Allergies (verified) Bextra [valdecoxib] and Lisinopril   History: Past Medical History:  Diagnosis Date   Arthritis    Colon polyps    Depression    GERD (gastroesophageal reflux disease)    Hyperlipidemia    Hypertension    Irritable bowel syndrome (IBS)    Kidney stones    sees Dr. Hessie Diener    Multiple lung nodules    both lung bases, appear benign, next scheduled CT will be march 2013   Past Surgical History:  Procedure Laterality Date   BREAST REDUCTION SURGERY     bilateral    CARPAL TUNNEL RELEASE     bilateral, per Dr. Cyndy Freeze   CATARACT EXTRACTION, BILATERAL Bilateral 2018   CESAREAN SECTION     CHOLECYSTECTOMY     COLONOSCOPY  05-13-15    per Dr. Fuller Plan, adenomatous polyp, repeat in 5 yrs    LUMBAR FUSION  2006   per Dr. Christella Noa   LUMBAR LAMINECTOMY     Family History  Problem Relation Age of Onset   Colon cancer Mother        1   Social History   Socioeconomic History   Marital status: Widowed    Spouse name: Not on file  Number of children: Not on file   Years of education: Not on file   Highest education level: Not on file  Occupational History   Not on file  Tobacco Use   Smoking status: Never   Smokeless tobacco: Never  Substance and Sexual Activity   Alcohol use: Yes    Alcohol/week: 0.0 standard drinks    Comment: rare   Drug use: No   Sexual activity: Not on file  Other Topics Concern   Not on file  Social History Narrative   Not on file   Social Determinants of Health   Financial Resource Strain: Low Risk    Difficulty of Paying Living Expenses: Not hard at all  Food Insecurity: No Food Insecurity   Worried About Charity fundraiser in the Last Year:  Never true   [place] in the Last Year: Never true  Transportation Needs: No Transportation Needs   Lack of Transportation (Medical): No   Lack of Transportation (Non-Medical): No  Physical Activity: Sufficiently Active   Days of Exercise per Week: 5 days   Minutes of Exercise per Session: 30 min  Stress: No Stress Concern Present   Feeling of Stress : Not at all  Social Connections: Moderately Integrated   Frequency of Communication with Friends and Family: More than three times a week   Frequency of Social Gatherings with Friends and Family: More than three times a week   Attends Religious Services: More than 4 times per year   Active Member of [employer] or Organizations: Yes   Attends Archivist Meetings: 1 to 4 times per year   Marital Status: Widowed    Tobacco Counseling Counseling given: Not Answered   Clinical Intake:  Pre-visit preparation completed: Yes  Pain : No/denies pain     BMI - recorded: 37.43 Nutritional Status: BMI > 30  Obese Nutritional Risks: None Diabetes: No  How often do you need to have someone help you when you read instructions, pamphlets, or other written materials from your doctor or pharmacy?: 1 - Never  Diabetic? No  Interpreter Needed?: No     Activities of Daily Living In your present state of health, do you have any difficulty performing the following activities: 01/20/2021  Hearing? N  Vision? N  Difficulty concentrating or making decisions? N  Walking or climbing stairs? N  Dressing or bathing? N  Doing errands, shopping? N  Preparing Food and eating ? N  Using the Toilet? N  In the past six months, have you accidently leaked urine? N  Do you have problems with loss of bowel control? N  Managing your Medications? N  Managing your Finances? N  Housekeeping or managing your Housekeeping? N  Some recent data might be hidden    Patient Care Team: Laurey Morale, MD as PCP - General  Indicate any recent  Medical Services you may have received from other than [facility] providers in the past year (date may be approximate).     Assessment:   This is a routine wellness examination for Robin Howe.  Hearing/Vision screen Hearing Screening - Comments:: Pt denies any hearing issues Vision Screening - Comments:: Pt follows up with Dr Encarnacion Slates at Dr Patrici Ranks office for annual eye exams   Dietary issues and exercise activities discussed: Current Exercise Habits: Home exercise routine, Type of exercise: walking, Time (Minutes): 30, Frequency (Times/Week): 5, Weekly Exercise (Minutes/Week): 150   Goals Addressed  This Visit's Progress    Patient Stated       Stay mobile and active       Depression Screen PHQ 2/9 Scores 01/20/2021 12/02/2020 12/19/2019 08/30/2017 07/15/2016  PHQ - 2 Score 0 0 0 0 0  PHQ- 9 Score - 6 0 - -    Fall Risk Fall Risk  01/20/2021 12/02/2020 12/19/2019 08/30/2017 07/15/2016  Falls in the past year? 0 0 0 No No  Number falls in past yr: 0 0 0 - -  Injury with Fall? 0 0 0 - -  Risk for fall due to : Impaired vision No Fall Risks No Fall Risks - -  Follow up Falls prevention discussed - Falls evaluation completed;Falls prevention discussed - -    FALL RISK PREVENTION PERTAINING TO THE HOME:  Any stairs in or around the home? Yes  If so, are there any without handrails? No  Home free of loose throw rugs in walkways, pet beds, electrical cords, etc? Yes  Adequate lighting in your home to reduce risk of falls? Yes   ASSISTIVE DEVICES UTILIZED TO PREVENT FALLS:  Life alert? Yes  Apple watch Use of a cane, walker or w/c? No  Grab bars in the bathroom? Yes  Shower chair or bench in shower? No  Elevated toilet seat or a handicapped toilet? No   TIMED UP AND GO:  Was the test performed? No .   Cognitive Function:        Immunizations Immunization History  Administered Date(s) Administered   Fluad Quad(high Dose 65+) 12/09/2018, 01/20/2020   Influenza  Split 03/17/2011, 01/26/2012   Influenza Whole 02/21/2007, 01/10/2008, 01/12/2009, 01/13/2010   Influenza, High Dose Seasonal PF 01/06/2017, 12/08/2017   Influenza,inj,Quad PF,6+ Mos 01/25/2013, 11/22/2013, 12/19/2014   Influenza-Unspecified 12/30/2015, 01/06/2017, 11/10/2018   PFIZER Comirnaty(Gray Top)Covid-19 Tri-Sucrose Vaccine 10/23/2020   PFIZER(Purple Top)SARS-COV-2 Vaccination 05/14/2019, 06/11/2019, 01/06/2020, 10/23/2020   PPD Test 11/22/2013   Pneumococcal Conjugate-13 11/08/2018   Pneumococcal Polysaccharide-23 11/18/2019   Tdap 11/22/2013    TDAP status: Up to date  Flu Vaccine status: Due, Education has been provided regarding the importance of this vaccine. Advised may receive this vaccine at local pharmacy or Health Dept. Aware to provide a copy of the vaccination record if obtained from local pharmacy or Health Dept. Verbalized acceptance and understanding.  Pneumococcal vaccine status: Up to date  Covid-19 vaccine status: Completed vaccines  Qualifies for Shingles Vaccine? Yes   Zostavax completed No   Shingrix Completed?: No.    Education has been provided regarding the importance of this vaccine. Patient has been advised to call insurance company to determine out of pocket expense if they have not yet received this vaccine. Advised may also receive vaccine at local pharmacy or Health Dept. Verbalized acceptance and understanding.  Screening Tests Health Maintenance  Topic Date Due   Hepatitis C Screening  Never done   Zoster Vaccines- Shingrix (1 of 2) Never done   COLONOSCOPY (Pts 45-12yrs Insurance coverage will need to be confirmed)  05/12/2020   INFLUENZA VACCINE  11/02/2020   MAMMOGRAM  11/17/2021   TETANUS/TDAP  11/23/2023   DEXA SCAN  Completed   COVID-19 Vaccine  Completed   HPV VACCINES  Aged Out    Health Maintenance  Health Maintenance Due  Topic Date Due   Hepatitis C Screening  Never done   Zoster Vaccines- Shingrix (1 of 2) Never done    COLONOSCOPY (Pts 45-52yrs Insurance coverage will need to be confirmed)  05/12/2020  INFLUENZA VACCINE  11/02/2020    Colorectal cancer screening: Referral to GI placed Scheduled Nov 15 with Dr Silvio Pate. Pt aware the office will call re: appt.  Mammogram status: Completed 11/23/20. Repeat every year  Bone Density status: Completed 09/05/17. Results reflect: Bone density results: OSTEOPENIA. Repeat every 2 years.    Additional Screening:  Hepatitis C Screening: does qualify  Vision Screening: Recommended annual ophthalmology exams for early detection of glaucoma and other disorders of the eye. Is the patient up to date with their annual eye exam?  Yes  Who is the provider or what is the name of the office in which the patient attends annual eye exams? Dr Bayard Males If pt is not established with a provider, would they like to be referred to a provider to establish care? No .   Dental Screening: Recommended annual dental exams for proper oral hygiene  Community Resource Referral / Chronic Care Management: CRR required this visit?  No   CCM required this visit?  No      Plan:     I have personally reviewed and noted the following in the patient's chart:   Medical and social history Use of alcohol, tobacco or illicit drugs  Current medications and supplements including opioid prescriptions.  Functional ability and status Nutritional status Physical activity Advanced directives List of other physicians Hospitalizations, surgeries, and ER visits in previous 12 months Vitals Screenings to include cognitive, depression, and falls Referrals and appointments  In addition, I have reviewed and discussed with patient certain preventive protocols, quality metrics, and best practice recommendations. A written personalized care plan for preventive services as well as general preventive health recommendations were provided to patient.     Willette Brace, LPN   40/01/2724   Nurse  Notes: None

## 2021-01-20 NOTE — Patient Instructions (Addendum)
Robin Howe , Thank you for taking time to come for your Medicare Wellness Visit. I appreciate your ongoing commitment to your health goals. Please review the following plan we discussed and let me know if I can assist you in the future.   Screening recommendations/referrals: Colonoscopy: Scheduled Feb 16, 2021 with Dr Silvio Pate Mammogram: Done 11/23/20 repeat every year  Bone Density: Done 09/05/17 repeat every 2 years  Recommended yearly ophthalmology/optometry visit for glaucoma screening and checkup Recommended yearly dental visit for hygiene and checkup  Vaccinations: Influenza vaccine: Due and discussed Pneumococcal vaccine: Up to date Tdap vaccine: Done 11/22/13 repeat every 10 years  Shingles vaccine: Shingrix discussed. Please contact your pharmacy for coverage information.    Covid-19:Completed 2/9, 3/9, 01/06/20 & 10/23/20  Advanced directives: Please bring a copy of your health care power of attorney and living will to the office at your convenience.  Conditions/risks identified: Stay mobile and active  Next appointment: Follow up in one year for your annual wellness visit    Preventive Care 65 Years and Older, Female Preventive care refers to lifestyle choices and visits with your health care provider that can promote health and wellness. What does preventive care include? A yearly physical exam. This is also called an annual well check. Dental exams once or twice a year. Routine eye exams. Ask your health care provider how often you should have your eyes checked. Personal lifestyle choices, including: Daily care of your teeth and gums. Regular physical activity. Eating a healthy diet. Avoiding tobacco and drug use. Limiting alcohol use. Practicing safe sex. Taking low-dose aspirin every day. Taking vitamin and mineral supplements as recommended by your health care provider. What happens during an annual well check? The services and screenings done by your health care  provider during your annual well check will depend on your age, overall health, lifestyle risk factors, and family history of disease. Counseling  Your health care provider may ask you questions about your: Alcohol use. Tobacco use. Drug use. Emotional well-being. Home and relationship well-being. Sexual activity. Eating habits. History of falls. Memory and ability to understand (cognition). Work and work Statistician. Reproductive health. Screening  You may have the following tests or measurements: Height, weight, and BMI. Blood pressure. Lipid and cholesterol levels. These may be checked every 5 years, or more frequently if you are over 37 years old. Skin check. Lung cancer screening. You may have this screening every year starting at age 26 if you have a 30-pack-year history of smoking and currently smoke or have quit within the past 15 years. Fecal occult blood test (FOBT) of the stool. You may have this test every year starting at age 65. Flexible sigmoidoscopy or colonoscopy. You may have a sigmoidoscopy every 5 years or a colonoscopy every 10 years starting at age 71. Hepatitis C blood test. Hepatitis B blood test. Sexually transmitted disease (STD) testing. Diabetes screening. This is done by checking your blood sugar (glucose) after you have not eaten for a while (fasting). You may have this done every 1-3 years. Bone density scan. This is done to screen for osteoporosis. You may have this done starting at age 71. Mammogram. This may be done every 1-2 years. Talk to your health care provider about how often you should have regular mammograms. Talk with your health care provider about your test results, treatment options, and if necessary, the need for more tests. Vaccines  Your health care provider may recommend certain vaccines, such as: Influenza vaccine. This is recommended  every year. Tetanus, diphtheria, and acellular pertussis (Tdap, Td) vaccine. You may need a Td  booster every 10 years. Zoster vaccine. You may need this after age 71. Pneumococcal 13-valent conjugate (PCV13) vaccine. One dose is recommended after age 60. Pneumococcal polysaccharide (PPSV23) vaccine. One dose is recommended after age 79. Talk to your health care provider about which screenings and vaccines you need and how often you need them. This information is not intended to replace advice given to you by your health care provider. Make sure you discuss any questions you have with your health care provider. Document Released: 04/17/2015 Document Revised: 12/09/2015 Document Reviewed: 01/20/2015 Elsevier Interactive Patient Education  2017 [place] Prevention in the Home Falls can cause injuries. They can happen to people of all ages. There are many things you can do to make your home safe and to help prevent falls. What can I do on the outside of my home? Regularly fix the edges of walkways and driveways and fix any cracks. Remove anything that might make you trip as you walk through a door, such as a raised step or threshold. Trim any bushes or trees on the path to your home. Use bright outdoor lighting. Clear any walking paths of anything that might make someone trip, such as rocks or tools. Regularly check to see if handrails are loose or broken. Make sure that both sides of any steps have handrails. Any raised decks and porches should have guardrails on the edges. Have any leaves, snow, or ice cleared regularly. Use sand or salt on walking paths during winter. Clean up any spills in your garage right away. This includes oil or grease spills. What can I do in the bathroom? Use night lights. Install grab bars by the toilet and in the tub and shower. Do not use towel bars as grab bars. Use non-skid mats or decals in the tub or shower. If you need to sit down in the shower, use a plastic, non-slip stool. Keep the floor dry. Clean up any water that spills on the floor  as soon as it happens. Remove soap buildup in the tub or shower regularly. Attach bath mats securely with double-sided non-slip rug tape. Do not have throw rugs and other things on the floor that can make you trip. What can I do in the bedroom? Use night lights. Make sure that you have a light by your bed that is easy to reach. Do not use any sheets or blankets that are too big for your bed. They should not hang down onto the floor. Have a firm chair that has side arms. You can use this for support while you get dressed. Do not have throw rugs and other things on the floor that can make you trip. What can I do in the kitchen? Clean up any spills right away. Avoid walking on wet floors. Keep items that you use a lot in easy-to-reach places. If you need to reach something above you, use a strong step stool that has a grab bar. Keep electrical cords out of the way. Do not use floor polish or wax that makes floors slippery. If you must use wax, use non-skid floor wax. Do not have throw rugs and other things on the floor that can make you trip. What can I do with my stairs? Do not leave any items on the stairs. Make sure that there are handrails on both sides of the stairs and use them. Fix handrails that are broken  or loose. Make sure that handrails are as long as the stairways. Check any carpeting to make sure that it is firmly attached to the stairs. Fix any carpet that is loose or worn. Avoid having throw rugs at the top or bottom of the stairs. If you do have throw rugs, attach them to the floor with carpet tape. Make sure that you have a light switch at the top of the stairs and the bottom of the stairs. If you do not have them, ask someone to add them for you. What else can I do to help prevent falls? Wear shoes that: Do not have high heels. Have rubber bottoms. Are comfortable and fit you well. Are closed at the toe. Do not wear sandals. If you use a stepladder: Make sure that it is  fully opened. Do not climb a closed stepladder. Make sure that both sides of the stepladder are locked into place. Ask someone to hold it for you, if possible. Clearly mark and make sure that you can see: Any grab bars or handrails. First and last steps. Where the edge of each step is. Use tools that help you move around (mobility aids) if they are needed. These include: Canes. Walkers. Scooters. Crutches. Turn on the lights when you go into a dark area. Replace any light bulbs as soon as they burn out. Set up your furniture so you have a clear path. Avoid moving your furniture around. If any of your floors are uneven, fix them. If there are any pets around you, be aware of where they are. Review your medicines with your doctor. Some medicines can make you feel dizzy. This can increase your chance of falling. Ask your doctor what other things that you can do to help prevent falls. This information is not intended to replace advice given to you by your health care provider. Make sure you discuss any questions you have with your health care provider. Document Released: 01/15/2009 Document Revised: 08/27/2015 Document Reviewed: 04/25/2014 Elsevier Interactive Patient Education  2017 [employer].

## 2021-01-21 DIAGNOSIS — I10 Essential (primary) hypertension: Secondary | ICD-10-CM | POA: Diagnosis not present

## 2021-01-21 DIAGNOSIS — Z6837 Body mass index (BMI) 37.0-37.9, adult: Secondary | ICD-10-CM | POA: Diagnosis not present

## 2021-01-21 DIAGNOSIS — R519 Headache, unspecified: Secondary | ICD-10-CM | POA: Diagnosis not present

## 2021-01-21 DIAGNOSIS — M542 Cervicalgia: Secondary | ICD-10-CM | POA: Diagnosis not present

## 2021-01-22 DIAGNOSIS — M4692 Unspecified inflammatory spondylopathy, cervical region: Secondary | ICD-10-CM | POA: Diagnosis not present

## 2021-02-01 DIAGNOSIS — M47812 Spondylosis without myelopathy or radiculopathy, cervical region: Secondary | ICD-10-CM | POA: Diagnosis not present

## 2021-02-03 ENCOUNTER — Other Ambulatory Visit: Payer: Self-pay

## 2021-02-03 ENCOUNTER — Encounter: Payer: Self-pay | Admitting: Gastroenterology

## 2021-02-03 ENCOUNTER — Ambulatory Visit (AMBULATORY_SURGERY_CENTER): Payer: Medicare PPO | Admitting: *Deleted

## 2021-02-03 VITALS — Ht 59.0 in | Wt 170.0 lb

## 2021-02-03 DIAGNOSIS — Z800 Family history of malignant neoplasm of digestive organs: Secondary | ICD-10-CM

## 2021-02-03 DIAGNOSIS — Z86010 Personal history of colonic polyps: Secondary | ICD-10-CM

## 2021-02-03 MED ORDER — NA SULFATE-K SULFATE-MG SULF 17.5-3.13-1.6 GM/177ML PO SOLN: 1.0000 | Freq: Once | ORAL | 0 refills | Status: AC

## 2021-02-03 NOTE — Progress Notes (Signed)
Pt's previsit is done over the phone and all paperwork (prep instructions, blank consent form to just read over) sent to patient and via [place].  Pt's name and DOB verified at the beginning of the previsit.  Pt denies any difficulty with ambulating.    No trouble with anesthesia, denies being told they were difficult to intubate, or hx/fam hx of malignant hyperthermia per pt   No egg or soy allergy  No home oxygen use   No medications for weight loss taken  Pt rarely takes Miralax for constipation  Pt informed that we do not do prior authorizations for prep

## 2021-02-16 ENCOUNTER — Ambulatory Visit: Payer: Medicare PPO | Admitting: Gastroenterology

## 2021-02-16 ENCOUNTER — Other Ambulatory Visit: Payer: Self-pay

## 2021-02-16 VITALS — Temp 98.0°F

## 2021-02-16 DIAGNOSIS — Z86010 Personal history of colonic polyps: Secondary | ICD-10-CM

## 2021-02-16 NOTE — Progress Notes (Signed)
Vitals-DT  Patient has been cancelled for today. Rescheduled for 04/20/2020 at 1:30pm. Previsit Dec 30th at 0800

## 2021-02-20 ENCOUNTER — Other Ambulatory Visit: Payer: Self-pay | Admitting: Family Medicine

## 2021-02-22 ENCOUNTER — Telehealth: Payer: Medicare PPO | Admitting: Family Medicine

## 2021-02-23 DIAGNOSIS — M47812 Spondylosis without myelopathy or radiculopathy, cervical region: Secondary | ICD-10-CM | POA: Diagnosis not present

## 2021-03-03 ENCOUNTER — Telehealth: Payer: Medicare PPO | Admitting: Family Medicine

## 2021-03-05 ENCOUNTER — Encounter: Payer: Self-pay | Admitting: Family Medicine

## 2021-03-05 ENCOUNTER — Telehealth (INDEPENDENT_AMBULATORY_CARE_PROVIDER_SITE_OTHER): Payer: Medicare PPO | Admitting: Family Medicine

## 2021-03-05 DIAGNOSIS — R159 Full incontinence of feces: Secondary | ICD-10-CM | POA: Diagnosis not present

## 2021-03-05 NOTE — Progress Notes (Signed)
   Subjective:    Patient ID: Robin Howe, female    DOB: July 16, 1950, 70 y.o.   MRN: [id number]  HPI Virtual Visit via Telephone Note  I connected with the patient on 03/05/21 at  2:45 PM EST by telephone and verified that I am speaking with the correct person using two identifiers.   I discussed the limitations, risks, security and privacy concerns of performing an evaluation and management service by telephone and the availability of in person appointments. I also discussed with the patient that there may be a patient responsible charge related to this service. The patient expressed understanding and agreed to proceed.  Location patient: home Location provider: work or home office Participants present for the call: patient, provider Patient did not have a visit in the prior 7 days to address this/these issue(s).   History of Present Illness: Here to discuss her bowel issues. She had attempted to get a colonoscopy on 02-16-21, but this was cancelled because she could never get her colon cleaned out. She says for the past 6 months her stools have been loose, and she often has some leakage of the stool. She has begun to wear adult diapers in case of accidents.    Observations/Objective: Patient sounds cheerful and well on the phone. I do not appreciate any SOB. Speech and thought processing are grossly intact. Patient reported vitals:  Assessment and Plan: Incontinence of stools. We will refer her to see GI (Dr. Fuller Plan) to address this issue before she attempts another colonoscopy.  [name], MD   Follow Up Instructions:     [phone] [phone] [id number] [id number] 21-30 I did not refer this patient for an OV in the next 24 hours for this/these issue(s).  I discussed the assessment and treatment plan with the patient. The patient was provided an opportunity to ask questions and all were answered. The patient agreed with the plan and demonstrated an understanding of the  instructions.   The patient was advised to call back or seek an in-person evaluation if the symptoms worsen or if the condition fails to improve as anticipated.  I provided 15 minutes of non-face-to-face time during this encounter.   [name], MD     Review of Systems     Objective:   Physical Exam        Assessment & Plan:

## 2021-03-12 ENCOUNTER — Telehealth: Payer: Self-pay | Admitting: *Deleted

## 2021-03-12 NOTE — Telephone Encounter (Signed)
Please review Dr.Fry notes from [place] 03/05/21. She is for colonoscopy on 04/20/2021. Okay to proceed as scheduled? Please advise. Thank you, Sheza Strickland pv

## 2021-03-15 NOTE — Telephone Encounter (Signed)
OK to keep colonoscopy scheduled. Ideally she should have an office visit with me or an APP prior to colonoscopy. If there are no office appts available then ok to proceed.

## 2021-03-15 NOTE — Telephone Encounter (Signed)
Patient notified of Dr.Stark's recommendations. Made appointment for OV with Colleen for 04/07/21-PV cancelled. Pt is aware. Colonoscopy still scheduled for 04/20/21.

## 2021-03-16 DIAGNOSIS — M47812 Spondylosis without myelopathy or radiculopathy, cervical region: Secondary | ICD-10-CM | POA: Diagnosis not present

## 2021-03-17 ENCOUNTER — Encounter: Payer: Self-pay | Admitting: Family Medicine

## 2021-04-07 ENCOUNTER — Encounter: Payer: Self-pay | Admitting: Nurse Practitioner

## 2021-04-07 ENCOUNTER — Ambulatory Visit: Payer: Medicare PPO | Admitting: Nurse Practitioner

## 2021-04-07 VITALS — BP 126/60 | HR 84 | Ht <= 58 in | Wt 177.2 lb

## 2021-04-07 DIAGNOSIS — Z86010 Personal history of colonic polyps: Secondary | ICD-10-CM

## 2021-04-07 DIAGNOSIS — K5900 Constipation, unspecified: Secondary | ICD-10-CM

## 2021-04-07 DIAGNOSIS — Z800 Family history of malignant neoplasm of digestive organs: Secondary | ICD-10-CM | POA: Diagnosis not present

## 2021-04-07 MED ORDER — PLENVU 140 G PO SOLR: ORAL | 0 refills | Status: DC

## 2021-04-07 NOTE — Progress Notes (Signed)
04/07/2021 Robin Howe [id number] 1950-08-18   CHIEF COMPLAINT: Schedule a colonoscopy, discuss the bowel prep  HISTORY OF PRESENT ILLNESS: Robin Howe is a 71 year old female with a past medical history of arthritis, depression, hypertension, hyperlipidemia kidney stones, GERD and colon polyps.  Past C-section and cholecystectomy.  She is followed by Dr. Fuller Plan.  She was scheduled for colonoscopy November 2022 but this procedure was canceled as she was unable to complete the bowel prep.  She took the first dose of Suprep which resulted in excessive diarrhea throughout the night but she did not clear.  She was still passing dark brown liquid diarrhea in the morning.  She is passing a fairly regular bowel movement most days.  When she was previously on gabapentin she was passing smaller softer stools which were difficult to wipe clean but her bowel pattern improved when gabapentin dose was reduced.  No rectal bleeding or black stools.  Her mother was diagnosed with colon cancer at the age of 61.  Her most recent colonoscopy was 05/13/2015, see results below.   Colonoscopy 05/13/2015: 1. Two sessile polyps in the transverse colon; polypectomies performed with a cold snare and with cold forceps 2. The colonic mucosa otherwise appeared normal - TUBULAR ADENOMA (X1). - POLYPOID FRAGMENT OF BENIGN COLONIC MUCOSA (X1). - NO HIGH GRADE DYSPLASIA OR MALIGNANCY  CBC Latest Ref Rng & Units 12/02/2020 11/18/2019 11/08/2018  WBC 4.0 - 10.5 K/uL 7.1 9.1 6.6  Hemoglobin 12.0 - 15.0 g/dL 13.9 12.6 14.2  Hematocrit 36.0 - 46.0 % 41.1 38.6 41.6  Platelets 150.0 - 400.0 K/uL 295.0 344 314.0    CMP Latest Ref Rng & Units 12/02/2020 11/18/2019 11/08/2018  Glucose 70 - 99 mg/dL 87 88 95  BUN 6 - 23 mg/dL 15 16 10   Creatinine 0.40 - 1.20 mg/dL 0.84 0.71 0.76  Sodium 135 - 145 mEq/L 140 138 141  Potassium 3.5 - 5.1 mEq/L 3.9 4.4 4.5  Chloride 96 - 112 mEq/L 105 101 103  CO2 19 - 32 mEq/L 24 26 29   Calcium  8.4 - 10.5 mg/dL 10.1 9.7 10.1  Total Protein 6.0 - 8.3 g/dL 7.8 7.1 7.3  Total Bilirubin 0.2 - 1.2 mg/dL 0.4 0.5 0.5  Alkaline Phos 39 - 117 U/L 116 - 108  AST 0 - 37 U/L 24 26 28   ALT 0 - 35 U/L 14 14 19     Past Medical History:  Diagnosis Date   Arthritis    Colon polyps    Depression    GERD (gastroesophageal reflux disease)    Hyperlipidemia    Hypertension    Irritable bowel syndrome (IBS)    Kidney stones    sees Dr. Hessie Diener    Multiple lung nodules    both lung bases, appear benign, next scheduled CT will be march 2013   Past Surgical History:  Procedure Laterality Date   BREAST REDUCTION SURGERY     bilateral    CARPAL TUNNEL RELEASE     bilateral, per Dr. Cyndy Freeze   CATARACT EXTRACTION, BILATERAL Bilateral 2018   CESAREAN SECTION     CHOLECYSTECTOMY     COLONOSCOPY  05-13-15    per Dr. Fuller Plan, adenomatous polyp, repeat in 5 yrs    LUMBAR FUSION  2006   per Dr. Christella Noa   LUMBAR LAMINECTOMY    She reported having low blood pressure following a C-section 33 years ago.  No problems with airway management/intubation with any past procedure or  surgery.  Social History: She is widowed.  She has 1 son.  Non-smoker.  Infrequent alcohol use.  Family History: Mother was diagnosed with colon cancer at 13. Sister with Alzheimer's disease.   Allergies  Allergen Reactions   Bextra [Valdecoxib]     rash   Lisinopril     Cough       Outpatient Encounter Medications as of 04/07/2021  Medication Sig   ALPRAZolam (XANAX) 0.5 MG tablet TAKE 1 TABLET (0.5 MG TOTAL) BY MOUTH AT BEDTIME AS NEEDED FOR SLEEP.   amLODipine (NORVASC) 5 MG tablet TAKE 1 TABLET BY MOUTH EVERY DAY   atorvastatin (LIPITOR) 20 MG tablet TAKE 1 TABLET BY MOUTH EVERY DAY   Cholecalciferol (VITAMIN D3 PO) Take by mouth daily.   Cyanocobalamin (VITAMIN B-12 PO) Take by mouth daily.   ELDERBERRY PO Take by mouth daily.   Ferrous Sulfate (IRON PO) Take by mouth. Takes 10 days prior to giving blood    gabapentin (NEURONTIN) 100 MG capsule Take 100 mg by mouth as needed.   meloxicam (MOBIC) 15 MG tablet Take 1 tablet (15 mg total) by mouth daily.   [DISCONTINUED] Polyethylene Glycol 3350 (MIRALAX PO) Take by mouth. PRN- very rarely per pt   No facility-administered encounter medications on file as of 04/07/2021.    REVIEW OF SYSTEMS:  Gen: Denies fever, sweats or chills. No weight loss.  CV: Denies chest pain, palpitations or edema. Resp: Denies cough, shortness of breath of hemoptysis.  GI: See HPI.  No GERD symptoms. GU : Denies urinary burning, blood in urine, increased urinary frequency or incontinence. MS: Denies joint pain, muscles aches or weakness. Derm: Denies rash, itchiness, skin lesions or unhealing ulcers. Psych: Denies depression, anxiety, memory loss or confusion. Heme: Denies bruising, bleeding. Neuro:  + Dizziness.  Endo:  Denies any problems with DM, thyroid or adrenal function.  PHYSICAL EXAM: BP 126/60 (BP Location: Left Arm, Patient Position: Sitting, Cuff Size: Normal)    Pulse 84    Ht 4\' 9"  (1.448 m) Comment: height measured without shoes   Wt 177 lb 4 oz (80.4 kg)    BMI 38.36 kg/m  General: 71 year old female in no acute distress. Head: Normocephalic and atraumatic. Eyes:  Sclerae non-icteric, conjunctive pink. Ears: Normal auditory acuity. Mouth: Dentition intact. No ulcers or lesions.  Neck: Supple, no lymphadenopathy or thyromegaly.  Lungs: Clear bilaterally to auscultation without wheezes, crackles or rhonchi. Heart: Regular rate and rhythm. No murmur, rub or gallop appreciated.  Abdomen: Soft, nontender, non distended. No masses. No hepatosplenomegaly. Normoactive bowel sounds x 4 quadrants.  Rectal: Deferred. Musculoskeletal: Symmetrical with no gross deformities. Skin: Warm and dry. No rash or lesions on visible extremities. Extremities: No edema. Neurological: Alert oriented x 4, no focal deficits.  Psychological:  Alert and cooperative. Normal  mood and affect.  ASSESSMENT AND PLAN:  101) 71 year old female with a history of tubular adenomatous polyps per colonoscopy 05/2015.  Family history of first-degree relative (Mother) with history of colorectal cancer.  Recent outpatient colonoscopy was canceled as the patient completed half of of the bowel prep (Suprep) which resulted significant diarrhea without clearing. -MiraLAX nightly for 5 nights prior to colonoscopy prep date -Plenvu prep -Colonoscopy benefits and risks discussed including risk with sedation, risk of bleeding, perforation and infection.  Patient proceed with colonoscopy 04/20/2021 with Dr. Fuller Plan as scheduled. -Further recommendations to be determined after colonoscopy completed       CC:  Laurey Morale, MD

## 2021-04-07 NOTE — Patient Instructions (Signed)
PROCEDURES: You have been scheduled for a colonoscopy. Please follow the written instructions given to you at your visit today. Please pick up your prep supplies at the pharmacy within the next 1-3 days. If you use inhalers (even only as needed), please bring them with you on the day of your procedure.  RECOMMENDATIONS: Miralax 1 capful mixed in 8 oz water or Gatorade and drink before bed 5 nights prior to colonoscopy prep date.  It was great seeing you today! Thank you for entrusting me with your care and choosing [facility].  Noralyn Pick, CRNP  The [facility] providers would like to encourage you to use [facility] to communicate with providers for non-urgent requests or questions.  Due to long hold times on the telephone, sending your provider a message by [facility] may be faster and more efficient way to get a response. Please allow 48 business hours for a response.  Please remember that this is for non-urgent requests/questions.  If you are age 21 or older, your body mass index should be between 23-30. Your Body mass index is 38.36 kg/m. If this is out of the aforementioned range listed, please consider follow up with your Primary Care Provider.  If you are age 39 or younger, your body mass index should be between 19-25. Your Body mass index is 38.36 kg/m. If this is out of the aformentioned range listed, please consider follow up with your Primary Care Provider.

## 2021-04-19 ENCOUNTER — Other Ambulatory Visit: Payer: Self-pay | Admitting: Family Medicine

## 2021-04-20 ENCOUNTER — Ambulatory Visit (AMBULATORY_SURGERY_CENTER): Payer: Medicare PPO | Admitting: Gastroenterology

## 2021-04-20 ENCOUNTER — Encounter: Payer: Self-pay | Admitting: Gastroenterology

## 2021-04-20 VITALS — BP 143/70 | HR 71 | Temp 98.7°F | Resp 17 | Ht <= 58 in | Wt 177.0 lb

## 2021-04-20 DIAGNOSIS — D123 Benign neoplasm of transverse colon: Secondary | ICD-10-CM | POA: Diagnosis not present

## 2021-04-20 DIAGNOSIS — Z800 Family history of malignant neoplasm of digestive organs: Secondary | ICD-10-CM

## 2021-04-20 DIAGNOSIS — Z86010 Personal history of colonic polyps: Secondary | ICD-10-CM

## 2021-04-20 HISTORY — PX: COLONOSCOPY: SHX174

## 2021-04-20 MED ORDER — SODIUM CHLORIDE 0.9 % IV SOLN: 500.0000 mL | Freq: Once | INTRAVENOUS | Status: DC

## 2021-04-20 NOTE — Op Note (Signed)
[facility] Patient Name: Robin Howe Procedure Date: 04/20/2021 1:27 PM MRN: [id number] Endoscopist: Ladene Artist , MD Age: 71 Referring MD:  Date of Birth: February 09, 1951 Gender: Female Account #: [id number] Procedure:                Colonoscopy Indications:              Surveillance: Personal history of adenomatous                            polyps on last colonoscopy > 5 years ago. Family                            history of colon cancer, first degree relative. Medicines:                Monitored Anesthesia Care Procedure:                Pre-Anesthesia Assessment:                           - Prior to the procedure, a History and Physical                            was performed, and patient medications and                            allergies were reviewed. The patient's tolerance of                            previous anesthesia was also reviewed. The risks                            and benefits of the procedure and the sedation                            options and risks were discussed with the patient.                            All questions were answered, and informed consent                            was obtained. Prior Anticoagulants: The patient has                            taken no previous anticoagulant or antiplatelet                            agents. ASA Grade Assessment: II - A patient with                            mild systemic disease. After reviewing the risks                            and benefits, the patient was deemed in  satisfactory condition to undergo the procedure.                           After obtaining informed consent, the colonoscope                            was passed under direct vision. Throughout the                            procedure, the patient's blood pressure, pulse, and                            oxygen saturations were monitored continuously. The                            CF [id number]  #[id number] was introduced through the anus                            and advanced to the the cecum, identified by                            appendiceal orifice and ileocecal valve. The                            ileocecal valve, appendiceal orifice, and rectum                            were photographed. The quality of the bowel                            preparation was good. The colonoscopy was performed                            without difficulty. The patient tolerated the                            procedure well. Scope In: 1:49:03 PM Scope Out: 2:07:32 PM Scope Withdrawal Time: 0 hours 14 minutes 51 seconds  Total Procedure Duration: 0 hours 18 minutes 29 seconds  Findings:                 The perianal and digital rectal examinations were                            normal.                           Two sessile polyps were found in the transverse                            colon. The polyps were 6 to 7 mm in size. These                            polyps were removed with a cold snare. Resection  and retrieval were complete.                           The exam was otherwise without abnormality on                            direct and retroflexion views. Complications:            No immediate complications. Estimated blood loss:                            None. Estimated Blood Loss:     Estimated blood loss: none. Impression:               - Two 6 to 7 mm polyps in the transverse colon,                            removed with a cold snare. Resected and retrieved.                           - The examination was otherwise normal on direct                            and retroflexion views. Recommendation:           - Consider repeat colonoscopy, likely 5 years,                            after studies are complete for surveillance based                            on pathology results.                           - Patient has a contact number available for                             emergencies. The signs and symptoms of potential                            delayed complications were discussed with the                            patient. Return to normal activities tomorrow.                            Written discharge instructions were provided to the                            patient.                           - Resume previous diet.                           - Continue present medications.                           -  Await pathology results. Ladene Artist, MD 04/20/2021 2:17:29 PM This report has been signed electronically.

## 2021-04-20 NOTE — Addendum Note (Signed)
Addended by: Evonnie Pat A on: 04/20/2021 04:02 PM   Modules accepted: Orders

## 2021-04-20 NOTE — Progress Notes (Signed)
See 04/07/2021 H&P, no changes.  °

## 2021-04-20 NOTE — Patient Instructions (Signed)
Handout provided on polyps.   YOU HAD AN ENDOSCOPIC PROCEDURE TODAY AT THE [facility]:   Refer to the procedure report that was given to you for any specific questions about what was found during the examination.  If the procedure report does not answer your questions, please call your gastroenterologist to clarify.  If you requested that your care partner not be given the details of your procedure findings, then the procedure report has been included in a sealed envelope for you to review at your convenience later.  YOU SHOULD EXPECT: Some feelings of bloating in the abdomen. Passage of more gas than usual.  Walking can help get rid of the air that was put into your GI tract during the procedure and reduce the bloating. If you had a lower endoscopy (such as a colonoscopy or flexible sigmoidoscopy) you may notice spotting of blood in your stool or on the toilet paper. If you underwent a bowel prep for your procedure, you may not have a normal bowel movement for a few days.  Please Note:  You might notice some irritation and congestion in your nose or some drainage.  This is from the oxygen used during your procedure.  There is no need for concern and it should clear up in a day or so.  SYMPTOMS TO REPORT IMMEDIATELY:  Following lower endoscopy (colonoscopy or flexible sigmoidoscopy):  Excessive amounts of blood in the stool  Significant tenderness or worsening of abdominal pains  Swelling of the abdomen that is new, acute  Fever of 100F or higher  For urgent or emergent issues, a gastroenterologist can be reached at any hour by calling [phone]. Do not use MyChart messaging for urgent concerns.    DIET:  We do recommend a small meal at first, but then you may proceed to your regular diet.  Drink plenty of fluids but you should avoid alcoholic beverages for 24 hours.  ACTIVITY:  You should plan to take it easy for the rest of today and you should NOT DRIVE or use heavy  machinery until tomorrow (because of the sedation medicines used during the test).    FOLLOW UP: Our staff will call the number listed on your records 48-72 hours following your procedure to check on you and address any questions or concerns that you may have regarding the information given to you following your procedure. If we do not reach you, we will leave a message.  We will attempt to reach you two times.  During this call, we will ask if you have developed any symptoms of COVID 19. If you develop any symptoms (ie: fever, flu-like symptoms, shortness of breath, cough etc.) before then, please call [phone].  If you test positive for Covid 19 in the 2 weeks post procedure, please call and report this information to us.    If any biopsies were taken you will be contacted by phone or by letter within the next 1-3 weeks.  Please call us at [phone] if you have not heard about the biopsies in 3 weeks.    SIGNATURES/CONFIDENTIALITY: You and/or your care partner have signed paperwork which will be entered into your electronic medical record.  These signatures attest to the fact that that the information above on your After Visit Summary has been reviewed and is understood.  Full responsibility of the confidentiality of this discharge information lies with you and/or your care-partner.  

## 2021-04-20 NOTE — Progress Notes (Signed)
Pt's states no medical or surgical changes since previsit or office visit. VS assessed by C.W 

## 2021-04-20 NOTE — Progress Notes (Signed)
To PACU, VSS. Report to [email or web address] 

## 2021-04-20 NOTE — Progress Notes (Signed)
Called to room to assist during endoscopic procedure.  Patient ID and intended procedure confirmed with present staff. Received instructions for my participation in the procedure from the performing physician.  

## 2021-04-22 ENCOUNTER — Telehealth: Payer: Self-pay

## 2021-04-22 NOTE — Telephone Encounter (Signed)
°  Follow up Call-  Call back number 04/20/2021 02/16/2021  Post procedure Call Back phone  # [phone] -  Permission to leave phone message Yes Yes  Some recent data might be hidden     Patient questions:  Do you have a fever, pain , or abdominal swelling? No. Pain Score  0 *  Have you tolerated food without any problems? Yes.    Have you been able to return to your normal activities? Yes.    Do you have any questions about your discharge instructions: Diet   No. Medications  No. Follow up visit  No.  Do you have questions or concerns about your Care? No.  Actions: * If pain score is 4 or above: No action needed, pain <4.  Have you developed a fever since your procedure? no  2.   Have you had an respiratory symptoms (SOB or cough) since your procedure? no  3.   Have you tested positive for COVID 19 since your procedure no  4.   Have you had any family members/close contacts diagnosed with the COVID 19 since your procedure?  no   If yes to any of these questions please route to Joylene John, RN and Joella Prince, RN

## 2021-04-28 ENCOUNTER — Encounter: Payer: Self-pay | Admitting: Gastroenterology

## 2021-05-02 ENCOUNTER — Other Ambulatory Visit: Payer: Self-pay | Admitting: Family Medicine

## 2021-05-10 ENCOUNTER — Encounter: Payer: Self-pay | Admitting: Family Medicine

## 2021-05-11 MED ORDER — METHYLPREDNISOLONE 4 MG PO TBPK: ORAL_TABLET | ORAL | 0 refills | Status: DC

## 2021-05-11 NOTE — Telephone Encounter (Signed)
I sent in a Medrol dose pack  

## 2021-05-14 DIAGNOSIS — M47812 Spondylosis without myelopathy or radiculopathy, cervical region: Secondary | ICD-10-CM | POA: Diagnosis not present

## 2021-05-23 ENCOUNTER — Other Ambulatory Visit: Payer: Self-pay | Admitting: Family Medicine

## 2021-06-01 DIAGNOSIS — M47812 Spondylosis without myelopathy or radiculopathy, cervical region: Secondary | ICD-10-CM | POA: Diagnosis not present

## 2021-06-28 DIAGNOSIS — M47812 Spondylosis without myelopathy or radiculopathy, cervical region: Secondary | ICD-10-CM | POA: Diagnosis not present

## 2021-07-05 ENCOUNTER — Other Ambulatory Visit: Payer: Self-pay | Admitting: Family Medicine

## 2021-07-05 NOTE — Telephone Encounter (Signed)
LOV was on 03/05/2021 ?Last refill done on 11/06/2020 ?Please advise ?

## 2021-07-12 ENCOUNTER — Encounter: Payer: Self-pay | Admitting: Family Medicine

## 2021-07-13 DIAGNOSIS — M47812 Spondylosis without myelopathy or radiculopathy, cervical region: Secondary | ICD-10-CM | POA: Diagnosis not present

## 2021-07-13 NOTE — Telephone Encounter (Signed)
No she is up to date for now  ?

## 2021-07-20 DIAGNOSIS — M47812 Spondylosis without myelopathy or radiculopathy, cervical region: Secondary | ICD-10-CM | POA: Diagnosis not present

## 2021-07-28 ENCOUNTER — Ambulatory Visit: Payer: Medicare PPO | Admitting: Family Medicine

## 2021-07-28 ENCOUNTER — Telehealth: Payer: Self-pay | Admitting: Family Medicine

## 2021-07-28 VITALS — BP 128/80 | HR 86 | Temp 99.4°F | Wt 173.4 lb

## 2021-07-28 DIAGNOSIS — R509 Fever, unspecified: Secondary | ICD-10-CM

## 2021-07-28 DIAGNOSIS — R519 Headache, unspecified: Secondary | ICD-10-CM

## 2021-07-28 DIAGNOSIS — B029 Zoster without complications: Secondary | ICD-10-CM

## 2021-07-28 LAB — POCT INFLUENZA A/B
Influenza A, POC: NEGATIVE
Influenza B, POC: NEGATIVE

## 2021-07-28 LAB — POC COVID19 BINAXNOW: SARS Coronavirus 2 Ag: NEGATIVE

## 2021-07-28 MED ORDER — VALACYCLOVIR HCL 1 G PO TABS: 1000.0000 mg | ORAL_TABLET | Freq: Three times a day (TID) | ORAL | 0 refills | Status: DC

## 2021-07-28 NOTE — Telephone Encounter (Signed)
Patient calling in with respiratory symptoms: ?Shortness of breath, chest pain, palpitations or other red words send to Triage ? ?Does the patient have a fever over 100, cough, congestion, sore throat, runny nose, lost of taste/smell (please list symptoms that patient has)? Temp yesterday 100.6,ha ? ?What date did symptoms start?07-25-2021 ?(If over 5 days ago, pt may be scheduled for in person visit) ? ?Have you tested for Covid in the last 5 days? No  ? ?If yes, was it positive '[]'$  OR negative '[]'$ ? If positive in the last 5 days, please schedule virtual visit now. If negative, schedule for an in person OV with the next available provider if PCP has no openings. Please also let patient know they will be tested again (follow the script below) ? ?"you will have to arrive 57mns prior to your appt time to be Covid tested. Please park in back of office at the [facility] & call [phone] let the staff know you have arrived. A staff member will meet you at your car to do a rapid covid test. Once the test has resulted you will be notified by phone of your results to determine if appt will remain an in person visit or be converted to a virtual/phone visit. If you arrive less than 357ms before your appt time, your visit will be automatically converted to virtual & any recommended testing will happen AFTER the visit." ? ?Pt has an appt with dr fry today 07-28-21 at 145 pm ?THINGS TO REMEMBER ? ?If no availability for virtual visit in office,  please schedule another  office ? ?If no availability at another [facility], please instruct patient that they can schedule an evisit or virtual visit through their mychart account. Visits up to 8pm ? ?patients can be seen in office 5 days after positive COVID test ? ?  ?

## 2021-07-28 NOTE — Progress Notes (Signed)
? ?  Subjective:  ? ? Patient ID: Robin Howe, female    DOB: 10-Feb-1951, 71 y.o.   MRN: [id number] ? ?HPI ?Here for 3 days of a low grade fever (to 101 degrees) and a painful rash on the right side of her face. No eye pain or vision changes. Taking Tylenol. ? ? ?Review of Systems  ?Constitutional:  Positive for fever.  ?HENT: Negative.    ?Eyes: Negative.   ?Respiratory: Negative.    ?Skin:  Positive for rash.  ? ?   ?Objective:  ? Physical Exam ?Constitutional:   ?   Appearance: She is not ill-appearing.  ?Eyes:  ?   Conjunctiva/sclera: Conjunctivae normal.  ?   Pupils: Pupils are equal, round, and reactive to light.  ?Cardiovascular:  ?   Rate and Rhythm: Normal rate and regular rhythm.  ?   Pulses: Normal pulses.  ?   Heart sounds: Normal heart sounds.  ?Pulmonary:  ?   Effort: Pulmonary effort is normal.  ?   Breath sounds: Normal breath sounds.  ?Lymphadenopathy:  ?   Cervical: No cervical adenopathy.  ?Skin: ?   Comments: There are patches of red macular or vesicular rash over the right parietal and temporal scalp, and around the right eye   ?Neurological:  ?   Mental Status: She is alert.  ? ? ? ? ? ?   ?Assessment & Plan:  ?Shingles, treat with 10 days of Valtrex 1000 mg TID. She knows to seek attention immediately if she develops pain or blurred vision in the right eye. After waiting for 3 months, she should get the shingles vaccine. ?[name], MD ? ? ?

## 2021-07-29 ENCOUNTER — Encounter: Payer: Self-pay | Admitting: Family Medicine

## 2021-07-30 ENCOUNTER — Ambulatory Visit: Payer: Medicare PPO | Admitting: Internal Medicine

## 2021-07-30 ENCOUNTER — Encounter: Payer: Self-pay | Admitting: Family Medicine

## 2021-07-30 ENCOUNTER — Encounter: Payer: Self-pay | Admitting: Internal Medicine

## 2021-07-30 VITALS — BP 118/62 | HR 80 | Temp 99.8°F | Ht <= 58 in | Wt 173.0 lb

## 2021-07-30 DIAGNOSIS — H5711 Ocular pain, right eye: Secondary | ICD-10-CM

## 2021-07-30 DIAGNOSIS — I10 Essential (primary) hypertension: Secondary | ICD-10-CM

## 2021-07-30 DIAGNOSIS — B0239 Other herpes zoster eye disease: Secondary | ICD-10-CM

## 2021-07-30 NOTE — Progress Notes (Signed)
Patient ID: Robin Howe, female   DOB: Aug 27, 1950, 71 y.o.   MRN: [id number] ? ? ? ?    Chief Complaint: follow up shingles outbreak ? ?     HPI:  Robin Howe is a 71 y.o. female here with c/o acute onset shingles outbreak to right forehead and periorbital area,  seen 4/26 per pcp and tx with valtrex, and asked to follow up for any worsening eye involvement.  Pt has had worsening periorbital swelling it seems in the past day, and maybe even pink to the eye itself?  Denies eye pain or vision change.  No fever, chills, or new HA though has some sensitive area to touch with the rash.  Pt denies chest pain, increased sob or doe, wheezing, orthopnea, PND, increased LE swelling, palpitations, dizziness or syncope.   Pt denies polydipsia, polyuria, or new focal neuro s/s.  ?      ?Wt Readings from Last 3 Encounters:  ?07/30/21 173 lb (78.5 kg)  ?07/28/21 173 lb 6 oz (78.6 kg)  ?04/20/21 177 lb (80.3 kg)  ? ?BP Readings from Last 3 Encounters:  ?07/30/21 118/62  ?07/28/21 128/80  ?04/20/21 (!) 143/70  ? ?      ?Past Medical History:  ?Diagnosis Date  ? Arthritis   ? Colon polyps   ? Depression   ? GERD (gastroesophageal reflux disease)   ? Hyperlipidemia   ? Hypertension   ? Irritable bowel syndrome (IBS)   ? Kidney stones   ? sees Dr. Hessie Diener   ? Multiple lung nodules   ? both lung bases, appear benign, next scheduled CT will be march 2013  ? ?Past Surgical History:  ?Procedure Laterality Date  ? BREAST REDUCTION SURGERY    ? bilateral   ? CARPAL TUNNEL RELEASE    ? bilateral, per Dr. Cyndy Freeze  ? CATARACT EXTRACTION, BILATERAL Bilateral 2018  ? CESAREAN SECTION    ? CHOLECYSTECTOMY    ? COLONOSCOPY  05-13-15   ? per Dr. Fuller Plan, adenomatous polyp, repeat in 5 yrs   ? LUMBAR FUSION  2006  ? per Dr. Christella Noa  ? LUMBAR LAMINECTOMY    ? ? reports that she has never smoked. She has never used smokeless tobacco. She reports current alcohol use. She reports that she does not use drugs. ?family history includes Alzheimer's  disease in her sister; Colon cancer in her mother; GER disease in her son. ?Allergies  ?Allergen Reactions  ? Bextra [Valdecoxib]   ?  rash  ? Lisinopril   ?  Cough   ? ?Current Outpatient Medications on File Prior to Visit  ?Medication Sig Dispense Refill  ? ALPRAZolam (XANAX) 0.5 MG tablet TAKE 1 TABLET (0.5 MG TOTAL) BY MOUTH AT BEDTIME AS NEEDED FOR SLEEP. 30 tablet 5  ? amLODipine (NORVASC) 5 MG tablet TAKE 1 TABLET BY MOUTH EVERY DAY 90 tablet 1  ? atorvastatin (LIPITOR) 20 MG tablet TAKE 1 TABLET BY MOUTH EVERY DAY 90 tablet 0  ? Ferrous Sulfate (IRON PO) Take by mouth. Takes 10 days prior to giving blood    ? gabapentin (NEURONTIN) 100 MG capsule Take 100 mg by mouth as needed.    ? meloxicam (MOBIC) 15 MG tablet Take 1 tablet (15 mg total) by mouth daily. 90 tablet 3  ? valACYclovir (VALTREX) 1000 MG tablet Take 1 tablet (1,000 mg total) by mouth 3 (three) times daily. 30 tablet 0  ? Cholecalciferol (VITAMIN D3 PO) Take by mouth daily. (Patient not taking:  Reported on 07/28/2021)    ? Cyanocobalamin (VITAMIN B-12 PO) Take by mouth daily. (Patient not taking: Reported on 07/28/2021)    ? ELDERBERRY PO Take by mouth daily. (Patient not taking: Reported on 07/28/2021)    ? ?No current facility-administered medications on file prior to visit.  ? ?     ROS:  All others reviewed and negative. ? ?Objective  ? ?     PE:  BP 118/62 (BP Location: Left Arm, Patient Position: Sitting, Cuff Size: Large)   Pulse 80   Temp 99.8 ?F (37.7 ?C) (Oral)   Ht '4\' 9"'$  (1.448 m)   Wt 173 lb (78.5 kg)   SpO2 96%   BMI 37.44 kg/m?  ? ?              Constitutional: Pt appears in NAD ?              HENT: Head: NCAT.  ?              Right Ear: External ear normal.   ?              Left Ear: External ear normal.  ?              Eyes: . Pupils are equal, round, and reactive to light. Conjunctivae bilateral are clear without erythema and EOM are normal ?              Nose: without d/c or deformity ?              Neck: Neck supple. Gross  normal ROM ?              Cardiovascular: Normal rate and regular rhythm.   ?              Pulmonary/Chest: Effort normal and breath sounds without rales or wheezing.  ?              Abd:  Soft, NT, ND, + BS, no organomegaly ?              Neurological: Pt is alert. At baseline orientation, motor grossly intact ?              Skin:  LE edema - none; right forehead grouped vesicles on erythem base also with right upper > lower lid swelling and non tender erythema ;  ?              Psychiatric: Pt behavior is normal without agitation  ? ?Micro: none ? ?Cardiac tracings I have personally interpreted today:  none ? ?Pertinent Radiological findings (summarize): none  ? ?Lab Results  ?Component Value Date  ? WBC 7.1 12/02/2020  ? HGB 13.9 12/02/2020  ? HCT 41.1 12/02/2020  ? PLT 295.0 12/02/2020  ? GLUCOSE 87 12/02/2020  ? CHOL 180 12/02/2020  ? TRIG 81.0 12/02/2020  ? HDL 58.20 12/02/2020  ? LDLDIRECT 155.7 02/20/2006  ? LDLCALC 106 (H) 12/02/2020  ? ALT 14 12/02/2020  ? AST 24 12/02/2020  ? NA 140 12/02/2020  ? K 3.9 12/02/2020  ? CL 105 12/02/2020  ? CREATININE 0.84 12/02/2020  ? BUN 15 12/02/2020  ? CO2 24 12/02/2020  ? TSH 4.49 12/02/2020  ? HGBA1C 5.3 12/02/2020  ? ?Assessment/Plan:  ?Robin Howe is a 71 y.o. White or Caucasian [1] female with  has a past medical history of Arthritis, Colon polyps, Depression, GERD (gastroesophageal reflux disease), Hyperlipidemia, Hypertension, Irritable bowel syndrome (IBS), Kidney  stones, and Multiple lung nodules. ? ?Eye pain, right ?Ok for urgent optho referral per pt request but low suspicion for shingles involvement of globe ? ?Shingles of eyelid ?Pt to continue the valtrex asd, already taking gabapentin and declines change  ? ?HTN (hypertension) ?BP Readings from Last 3 Encounters:  ?07/30/21 118/62  ?07/28/21 128/80  ?04/20/21 (!) 143/70  ? ?Stable, pt to continue medical treatment norvasc ? ?Followup: Return if symptoms worsen or fail to improve. ? ?Cathlean Cower, MD  07/31/2021 10:24 AM ?[facility] ?[place] ?Internal Medicine ?

## 2021-07-30 NOTE — Patient Instructions (Signed)
You will be contacted regarding the referral for: urgent to Dr Katy Fitch for the right eye ? ?Please continue all other medications as before ? ?Please have the pharmacy call with any other refills you may need. ? ?Please keep your appointments with your specialists as you may have planned ? ? ? ?

## 2021-07-30 NOTE — Telephone Encounter (Signed)
Take the Ibuprofen in addition to the Valtrex. She can take 600-800 mg every 6 hours if needed for fever  ?

## 2021-07-31 ENCOUNTER — Other Ambulatory Visit: Payer: Self-pay | Admitting: Family Medicine

## 2021-07-31 ENCOUNTER — Encounter: Payer: Self-pay | Admitting: Internal Medicine

## 2021-07-31 NOTE — Assessment & Plan Note (Signed)
Ok for urgent optho referral per pt request but low suspicion for shingles involvement of globe ?

## 2021-07-31 NOTE — Assessment & Plan Note (Signed)
BP Readings from Last 3 Encounters:  ?07/30/21 118/62  ?07/28/21 128/80  ?04/20/21 (!) 143/70  ? ?Stable, pt to continue medical treatment norvasc ? ?

## 2021-07-31 NOTE — Assessment & Plan Note (Signed)
Pt to continue the valtrex asd, already taking gabapentin and declines change  ?

## 2021-08-05 DIAGNOSIS — B0232 Zoster iridocyclitis: Secondary | ICD-10-CM | POA: Diagnosis not present

## 2021-08-05 DIAGNOSIS — Z961 Presence of intraocular lens: Secondary | ICD-10-CM | POA: Diagnosis not present

## 2021-08-12 ENCOUNTER — Encounter: Payer: Self-pay | Admitting: Internal Medicine

## 2021-08-12 DIAGNOSIS — B0232 Zoster iridocyclitis: Secondary | ICD-10-CM | POA: Diagnosis not present

## 2021-08-15 ENCOUNTER — Encounter: Payer: Self-pay | Admitting: Family Medicine

## 2021-08-17 NOTE — Telephone Encounter (Signed)
No I am not aware of any chronic fatigue associated with shingles. I would wait on the hair treatments until after the skin soreness goes away  ?

## 2021-08-21 ENCOUNTER — Encounter: Payer: Self-pay | Admitting: Family Medicine

## 2021-08-22 ENCOUNTER — Encounter: Payer: Self-pay | Admitting: Emergency Medicine

## 2021-08-22 ENCOUNTER — Ambulatory Visit
Admission: EM | Admit: 2021-08-22 | Discharge: 2021-08-22 | Disposition: A | Discharge status: Home / Self Care | Payer: Medicare PPO | Attending: Internal Medicine | Admitting: Internal Medicine

## 2021-08-22 DIAGNOSIS — B028 Zoster with other complications: Secondary | ICD-10-CM | POA: Diagnosis not present

## 2021-08-22 DIAGNOSIS — L509 Urticaria, unspecified: Secondary | ICD-10-CM

## 2021-08-22 MED ORDER — HYDROXYZINE HCL 10 MG PO TABS: 10.0000 mg | ORAL_TABLET | Freq: Four times a day (QID) | ORAL | 0 refills | Status: DC | PRN

## 2021-08-22 NOTE — ED Triage Notes (Signed)
Patient recently treated for shingles, finished medication.  Now patient is having severe itching on right eye, face and hair.  Patient has applied ice, cold compresses, anti-itch cream.

## 2021-08-22 NOTE — ED Provider Notes (Signed)
[facility]    CSN: [id number] Arrival date & time: 08/22/21  0800      History   Chief Complaint Chief Complaint  Patient presents with   Itching    HPI Robin Howe is a 71 y.o. female.   Patient presents for persistent itchy rash after being treated for shingles at the end of April.  Patient reports that she has finished the antiviral Valtrex medication but has been having persistent itchiness where her rash had occurred.  Itchiness is present overlying right eye, right side of face, scalp.  Patient has applied cool compresses and anti-itch cream with no improvement.  She was referred to ophthalmology due to rash overlying eye.  She has been seen by ophthalmology twice and was prescribed cortisone eyedrops that she is currently taking 3 times a day.  Denies any fevers over the past few days.  Denies any new changes in environment including lotions, soaps, detergents, foods, etc.  Denies any blurry vision or pain to the eye.    Past Medical History:  Diagnosis Date   Arthritis    Colon polyps    Depression    GERD (gastroesophageal reflux disease)    Hyperlipidemia    Hypertension    Irritable bowel syndrome (IBS)    Kidney stones    sees Dr. Hessie Diener    Multiple lung nodules    both lung bases, appear benign, next scheduled CT will be march 2013    Patient Active Problem List   Diagnosis Date Noted   Shingles of eyelid 07/30/2021   Eye pain, right 07/30/2021   Chronic headaches 01/04/2021   Chronic neck pain 04/24/2019   Insomnia 08/23/2016   Allergic rhinitis 11/22/2013   HTN (hypertension) 06/04/2013   NEPHROLITHIASIS 01/13/2010   LUNG NODULE 12/25/2009   ABDOMINAL PAIN, RIGHT LOWER QUADRANT 12/25/2009   MORBID OBESITY 01/10/2008   Dyslipidemia 06/18/2007   Osteoarthritis 06/18/2007   DEPRESSION, ACUTE, RECURRENT 05/18/2007   GERD 01/10/2007    Past Surgical History:  Procedure Laterality Date   BREAST REDUCTION SURGERY      bilateral    CARPAL TUNNEL RELEASE     bilateral, per Dr. Cyndy Freeze   CATARACT EXTRACTION, BILATERAL Bilateral 2018   CESAREAN SECTION     CHOLECYSTECTOMY     COLONOSCOPY  05-13-15    per Dr. Fuller Plan, adenomatous polyp, repeat in 5 yrs    LUMBAR FUSION  2006   per Dr. Christella Noa   LUMBAR LAMINECTOMY      OB History   No obstetric history on file.      Home Medications    Prior to Admission medications   Medication Sig Start Date End Date Taking? Authorizing Provider  ALPRAZolam Duanne Moron) 0.5 MG tablet TAKE 1 TABLET (0.5 MG TOTAL) BY MOUTH AT BEDTIME AS NEEDED FOR SLEEP. 07/06/21  Yes Laurey Morale, MD  amLODipine (NORVASC) 5 MG tablet TAKE 1 TABLET BY MOUTH EVERY DAY 05/24/21  Yes Laurey Morale, MD  atorvastatin (LIPITOR) 20 MG tablet TAKE 1 TABLET BY MOUTH EVERY DAY 08/02/21  Yes Laurey Morale, MD  Ferrous Sulfate (IRON PO) Take by mouth. Takes 10 days prior to giving blood   Yes [provider]  gabapentin (NEURONTIN) 100 MG capsule Take 100 mg by mouth as needed.   Yes [provider]  hydrOXYzine (ATARAX) 10 MG tablet Take 1 tablet (10 mg total) by mouth every 6 (six) hours as needed for itching. 08/22/21  Yes [place], Chester E,  FNP  meloxicam (MOBIC) 15 MG tablet Take 1 tablet (15 mg total) by mouth daily. 12/02/20  Yes Laurey Morale, MD  valACYclovir (VALTREX) 1000 MG tablet Take 1 tablet (1,000 mg total) by mouth 3 (three) times daily. 07/28/21  Yes Laurey Morale, MD  Cholecalciferol (VITAMIN D3 PO) Take by mouth daily. Patient not taking: Reported on 07/28/2021    [provider]  Cyanocobalamin (VITAMIN B-12 PO) Take by mouth daily. Patient not taking: Reported on 07/28/2021    [provider]  ELDERBERRY PO Take by mouth daily. Patient not taking: Reported on 07/28/2021    [provider]    Family History Family History  Problem Relation Age of Onset   Colon cancer Mother        16   Alzheimer's disease Sister    GER disease Son     Esophageal cancer Neg Hx    Rectal cancer Neg Hx    Stomach cancer Neg Hx     Social History Social History   Tobacco Use   Smoking status: Never   Smokeless tobacco: Never  Vaping Use   Vaping Use: Never used  Substance Use Topics   Alcohol use: Yes    Alcohol/week: 0.0 standard drinks    Comment: rare   Drug use: No     Allergies   Bextra [valdecoxib] and Lisinopril   Review of Systems Review of Systems Per HPI  Physical Exam Triage Vital Signs ED Triage Vitals  Enc Vitals Group     BP 08/22/21 0812 134/77     Pulse Rate 08/22/21 0812 79     Resp 08/22/21 0812 18     Temp 08/22/21 0812 98.1 F (36.7 C)     Temp Source 08/22/21 0812 Oral     SpO2 08/22/21 0812 97 %     Weight 08/22/21 0814 170 lb (77.1 kg)     Height 08/22/21 0814 '4\' 11"'$  (1.499 m)     Head Circumference --      Peak Flow --      Pain Score 08/22/21 0814 0     Pain Loc --      Pain Edu? --      Excl. in [place]? --    No data found.  Updated Vital Signs BP 134/77 (BP Location: Left Arm)   Pulse 79   Temp 98.1 F (36.7 C) (Oral)   Resp 18   Ht '4\' 11"'$  (1.499 m)   Wt 170 lb (77.1 kg)   SpO2 97%   BMI 34.34 kg/m   Visual Acuity Right Eye Distance:   Left Eye Distance:   Bilateral Distance:    Right Eye Near:   Left Eye Near:    Bilateral Near:     Physical Exam Constitutional:      General: She is not in acute distress.    Appearance: Normal appearance. She is not toxic-appearing or diaphoretic.  HENT:     Head: Normocephalic and atraumatic.  Eyes:     General: Lids are everted, no foreign bodies appreciated. Vision grossly intact.     Extraocular Movements: Extraocular movements intact.     Conjunctiva/sclera: Conjunctivae normal.     Pupils: Pupils are equal, round, and reactive to light.  Pulmonary:     Effort: Pulmonary effort is normal.  Skin:    Comments: Mild erythema noted overlying right eyelid and directly above right eyebrow.  This erythema extends slightly into  the right side of face.  No  obvious discoloration or rash present to scalp.  No lesions noted.  No purulent drainage noted.  Neurological:     General: No focal deficit present.     Mental Status: She is alert and oriented to person, place, and time. Mental status is at baseline.  Psychiatric:        Mood and Affect: Mood normal.        Behavior: Behavior normal.        Thought Content: Thought content normal.        Judgment: Judgment normal.     UC Treatments / Results  Labs (all labs ordered are listed, but only abnormal results are displayed) Labs Reviewed - No data to display  EKG   Radiology No results found.  Procedures Procedures (including critical care time)  Medications Ordered in UC Medications - No data to display  Initial Impression / Assessment and Plan / UC Course  I have reviewed the triage vital signs and the nursing notes.  Pertinent labs & imaging results that were available during my care of the patient were reviewed by me and considered in my medical decision making (see chart for details).     It appears the patient has persistent urticaria from herpes zoster.  Do think the patient would benefit from prednisone steroid, although she is already taking steroid eyedrops so do not want to cause adverse reactions while taking with these medications together.  Other option is taking hydroxyzine antihistamine to help alleviate itching.  Prescribed hydroxyzine but patient was strongly advised that this can cause drowsiness and do not take any other sedating medications while taking this medication.  Advised specifically to not to take her prescribed PRN Xanax while taking this medication.  She was advised to follow-up if symptoms persist or worsen or if she develops blurry vision in the eye.  Do not think that fluorescein stain of the eye is necessary given no blurry vision and that patient has already been evaluated by ophthalmology.  Patient verbalized  understanding and was agreeable with plan. Final Clinical Impressions(s) / UC Diagnoses   Final diagnoses:  Herpes zoster with other complication  Urticaria     Discharge Instructions      You have been prescribed hydroxyzine to take as needed for itching.  Please be advised that it can cause drowsiness.  Do not take any other sedating medications while taking this medication.  Follow-up if symptoms persist or worsen.    ED Prescriptions     Medication Sig Dispense Auth. Provider   hydrOXYzine (ATARAX) 10 MG tablet Take 1 tablet (10 mg total) by mouth every 6 (six) hours as needed for itching. 20 tablet Roberts, Michele Rockers, [place]      PDMP not reviewed this encounter.   Teodora Medici, [place] 08/22/21 [phone]

## 2021-08-22 NOTE — Discharge Instructions (Signed)
You have been prescribed hydroxyzine to take as needed for itching.  Please be advised that it can cause drowsiness.  Do not take any other sedating medications while taking this medication.  Follow-up if symptoms persist or worsen.

## 2021-08-23 ENCOUNTER — Encounter: Payer: Self-pay | Admitting: Family Medicine

## 2021-08-23 NOTE — Telephone Encounter (Signed)
Pt asking if her provider would prescribe something to help with the itching.

## 2021-08-24 ENCOUNTER — Ambulatory Visit: Payer: Medicare PPO | Admitting: Internal Medicine

## 2021-08-24 VITALS — BP 136/80 | HR 79 | Temp 98.4°F | Ht 59.0 in | Wt 172.8 lb

## 2021-08-24 DIAGNOSIS — B0229 Other postherpetic nervous system involvement: Secondary | ICD-10-CM | POA: Diagnosis not present

## 2021-08-24 DIAGNOSIS — M47812 Spondylosis without myelopathy or radiculopathy, cervical region: Secondary | ICD-10-CM | POA: Diagnosis not present

## 2021-08-24 MED ORDER — GABAPENTIN 300 MG PO CAPS: 300.0000 mg | ORAL_CAPSULE | Freq: Every day | ORAL | 1 refills | Status: DC

## 2021-08-24 MED ORDER — METHYLPREDNISOLONE 4 MG PO TBPK: ORAL_TABLET | ORAL | 0 refills | Status: DC

## 2021-08-24 NOTE — Telephone Encounter (Signed)
I sent in a Medrol dose pack. She can take this in addition to the eye drops

## 2021-08-24 NOTE — Progress Notes (Signed)
Established Patient Office Visit     CC/Reason for Visit: Pain and itching  HPI: Robin Howe is a 71 y.o. female who is coming in today for the above mentioned reasons.  She was diagnosed with shingles and of April.  She was treated with valacyclovir.  She was seen by ophthalmologist given dermatomal involvement of the eye and was placed on cortisone drops.  She had a follow-up appointment and was told that everything was well.  She continues to have significant pain, itching of her right forehead, right parietal scalp area and eyelid.  Pain is significant and inhibiting sleep.  Rash is now completely resolved.  Past Medical/Surgical History: Past Medical History:  Diagnosis Date   Arthritis    Colon polyps    Depression    GERD (gastroesophageal reflux disease)    Hyperlipidemia    Hypertension    Irritable bowel syndrome (IBS)    Kidney stones    sees Dr. Hessie Diener    Multiple lung nodules    both lung bases, appear benign, next scheduled CT will be march 2013    Past Surgical History:  Procedure Laterality Date   BREAST REDUCTION SURGERY     bilateral    CARPAL TUNNEL RELEASE     bilateral, per Dr. Cyndy Freeze   CATARACT EXTRACTION, BILATERAL Bilateral 2018   CESAREAN SECTION     CHOLECYSTECTOMY     COLONOSCOPY  05-13-15    per Dr. Fuller Plan, adenomatous polyp, repeat in 5 yrs    LUMBAR FUSION  2006   per Dr. Christella Noa   LUMBAR LAMINECTOMY      Social History:  reports that she has never smoked. She has never used smokeless tobacco. She reports current alcohol use. She reports that she does not use drugs.  Allergies: Allergies  Allergen Reactions   Bextra [Valdecoxib]     rash   Lisinopril     Cough     Family History:  Family History  Problem Relation Age of Onset   Colon cancer Mother        42   Alzheimer's disease Sister    GER disease Son    Esophageal cancer Neg Hx    Rectal cancer Neg Hx    Stomach cancer Neg Hx      Current Outpatient  Medications:    ALPRAZolam (XANAX) 0.5 MG tablet, TAKE 1 TABLET (0.5 MG TOTAL) BY MOUTH AT BEDTIME AS NEEDED FOR SLEEP., Disp: 30 tablet, Rfl: 5   amLODipine (NORVASC) 5 MG tablet, TAKE 1 TABLET BY MOUTH EVERY DAY, Disp: 90 tablet, Rfl: 1   atorvastatin (LIPITOR) 20 MG tablet, TAKE 1 TABLET BY MOUTH EVERY DAY, Disp: 90 tablet, Rfl: 0   Ferrous Sulfate (IRON PO), Take by mouth. Takes 10 days prior to giving blood, Disp: , Rfl:    gabapentin (NEURONTIN) 100 MG capsule, Take 100 mg by mouth as needed., Disp: , Rfl:    gabapentin (NEURONTIN) 300 MG capsule, Take 1 capsule (300 mg total) by mouth at bedtime., Disp: 90 capsule, Rfl: 1   hydrOXYzine (ATARAX) 10 MG tablet, Take 1 tablet (10 mg total) by mouth every 6 (six) hours as needed for itching., Disp: 20 tablet, Rfl: 0   meloxicam (MOBIC) 15 MG tablet, Take 1 tablet (15 mg total) by mouth daily., Disp: 90 tablet, Rfl: 3   methylPREDNISolone (MEDROL DOSEPAK) 4 MG TBPK tablet, As directed, Disp: 21 tablet, Rfl: 0   valACYclovir (VALTREX) 1000 MG tablet, Take 1  tablet (1,000 mg total) by mouth 3 (three) times daily., Disp: 30 tablet, Rfl: 0   Cholecalciferol (VITAMIN D3 PO), Take by mouth daily. (Patient not taking: Reported on 08/24/2021), Disp: , Rfl:    Cyanocobalamin (VITAMIN B-12 PO), Take by mouth daily. (Patient not taking: Reported on 08/24/2021), Disp: , Rfl:    ELDERBERRY PO, Take by mouth daily. (Patient not taking: Reported on 08/24/2021), Disp: , Rfl:   Review of Systems:  Constitutional: Denies fever, chills, diaphoresis, appetite change. HEENT: Denies photophobia, eye pain, redness, hearing loss, ear pain, congestion, sore throat, rhinorrhea, sneezing, mouth sores, trouble swallowing, neck pain, neck stiffness and tinnitus.   Respiratory: Denies SOB, DOE, cough, chest tightness,  and wheezing.   Cardiovascular: Denies chest pain, palpitations and leg swelling.  Gastrointestinal: Denies nausea, vomiting, abdominal pain, diarrhea,  constipation, blood in stool and abdominal distention.  Genitourinary: Denies dysuria, urgency, frequency, hematuria, flank pain and difficulty urinating.  Endocrine: Denies: hot or cold intolerance, sweats, changes in hair or nails, polyuria, polydipsia. Musculoskeletal: Denies myalgias, back pain, joint swelling, arthralgias and gait problem.  Skin: Denies pallor, rash and wound.  Neurological: Denies dizziness, seizures, syncope, weakness, light-headedness, numbness and headaches.  Hematological: Denies adenopathy. Easy bruising, personal or family bleeding history  Psychiatric/Behavioral: Denies suicidal ideation, mood changes, confusion, nervousness, sleep disturbance and agitation    Physical Exam: Vitals:   08/24/21 1345  BP: 136/80  Pulse: 79  Temp: 98.4 F (36.9 C)  TempSrc: Oral  SpO2: 95%  Weight: 172 lb 12.8 oz (78.4 kg)  Height: '4\' 11"'$  (1.499 m)    Body mass index is 34.9 kg/m.   Constitutional: NAD, calm, comfortable Eyes: PERRL, lids and conjunctivae normal ENMT: Mucous membranes are moist.   Psychiatric: Normal judgment and insight. Alert and oriented x 3. Normal mood.    Impression and Plan:  Post herpetic neuralgia  - Plan: gabapentin (NEURONTIN) 300 MG capsule -Her symptoms are consistent with postherpetic neuralgia. -Have advised that the symptoms can last a few months. -Have recommended that she start gabapentin 300 mg at bedtime and that she follow-up with [place] if symptoms fail to improve.    Time spent:31 minutes reviewing chart, interviewing and examining patient and formulating plan of care.   Patient Instructions  -Nice seeing you today!!  -Start gabapentin 300 mg at bedtime.    Lelon Frohlich, MD [facility]

## 2021-08-24 NOTE — Patient Instructions (Signed)
-  Nice seeing you today!!  -Start gabapentin 300 mg at bedtime.

## 2021-09-06 ENCOUNTER — Encounter: Payer: Self-pay | Admitting: Family Medicine

## 2021-09-06 NOTE — Telephone Encounter (Signed)
I advise waiting 2 months after getting over shingles

## 2021-10-06 DIAGNOSIS — M47812 Spondylosis without myelopathy or radiculopathy, cervical region: Secondary | ICD-10-CM | POA: Diagnosis not present

## 2021-10-10 ENCOUNTER — Encounter: Payer: Self-pay | Admitting: Family Medicine

## 2021-10-11 MED ORDER — MELOXICAM 15 MG PO TABS: 15.0000 mg | ORAL_TABLET | Freq: Every day | ORAL | 3 refills | Status: DC

## 2021-10-11 NOTE — Telephone Encounter (Signed)
I refilled the Meloxicam

## 2021-10-31 ENCOUNTER — Encounter: Payer: Self-pay | Admitting: Family Medicine

## 2021-11-01 NOTE — Telephone Encounter (Signed)
Yes I am fairly sure that the CDC will recommend a yearly Covid shot in the fall along with the flu shot (we are still waiting for the announcement)

## 2021-11-12 DIAGNOSIS — M47812 Spondylosis without myelopathy or radiculopathy, cervical region: Secondary | ICD-10-CM | POA: Diagnosis not present

## 2021-11-21 ENCOUNTER — Other Ambulatory Visit: Payer: Self-pay | Admitting: Family Medicine

## 2021-11-29 DIAGNOSIS — Z1231 Encounter for screening mammogram for malignant neoplasm of breast: Secondary | ICD-10-CM | POA: Diagnosis not present

## 2021-12-23 ENCOUNTER — Other Ambulatory Visit: Payer: Self-pay | Admitting: Family Medicine

## 2021-12-28 ENCOUNTER — Ambulatory Visit (INDEPENDENT_AMBULATORY_CARE_PROVIDER_SITE_OTHER): Payer: Medicare PPO | Admitting: Family Medicine

## 2021-12-28 ENCOUNTER — Encounter: Payer: Self-pay | Admitting: Family Medicine

## 2021-12-28 VITALS — BP 124/62 | HR 74 | Temp 98.1°F | Ht 59.0 in | Wt 171.0 lb

## 2021-12-28 DIAGNOSIS — K219 Gastro-esophageal reflux disease without esophagitis: Secondary | ICD-10-CM

## 2021-12-28 DIAGNOSIS — R739 Hyperglycemia, unspecified: Secondary | ICD-10-CM | POA: Diagnosis not present

## 2021-12-28 DIAGNOSIS — M150 Primary generalized (osteo)arthritis: Secondary | ICD-10-CM

## 2021-12-28 DIAGNOSIS — E785 Hyperlipidemia, unspecified: Secondary | ICD-10-CM

## 2021-12-28 DIAGNOSIS — M159 Polyosteoarthritis, unspecified: Secondary | ICD-10-CM

## 2021-12-28 DIAGNOSIS — M542 Cervicalgia: Secondary | ICD-10-CM | POA: Diagnosis not present

## 2021-12-28 DIAGNOSIS — G8929 Other chronic pain: Secondary | ICD-10-CM

## 2021-12-28 DIAGNOSIS — I10 Essential (primary) hypertension: Secondary | ICD-10-CM | POA: Diagnosis not present

## 2021-12-28 LAB — TSH: TSH: 2.83 u[IU]/mL (ref 0.35–5.50)

## 2021-12-28 LAB — HEPATIC FUNCTION PANEL
ALT: 15 U/L (ref 0–35)
AST: 26 U/L (ref 0–37)
Albumin: 4.6 g/dL (ref 3.5–5.2)
Alkaline Phosphatase: 101 U/L (ref 39–117)
Bilirubin, Direct: 0.1 mg/dL (ref 0.0–0.3)
Total Bilirubin: 0.7 mg/dL (ref 0.2–1.2)
Total Protein: 7.9 g/dL (ref 6.0–8.3)

## 2021-12-28 LAB — LIPID PANEL
Cholesterol: 161 mg/dL (ref 0–200)
HDL: 56.8 mg/dL (ref >39.00)
LDL Cholesterol: 82 mg/dL (ref 0–99)
NonHDL: 104.33
Total CHOL/HDL Ratio: 3
Triglycerides: 111 mg/dL (ref 0.0–149.0)
VLDL: 22.2 mg/dL (ref 0.0–40.0)

## 2021-12-28 LAB — CBC WITH DIFFERENTIAL/PLATELET
Basophils Absolute: 0.1 10*3/uL (ref 0.0–0.1)
Basophils Relative: 1.1 % (ref 0.0–3.0)
Eosinophils Absolute: 0.5 10*3/uL (ref 0.0–0.7)
Eosinophils Relative: 6.1 % — ABNORMAL HIGH (ref 0.0–5.0)
HCT: 42.6 % (ref 36.0–46.0)
Hemoglobin: 14.7 g/dL (ref 12.0–15.0)
Lymphocytes Relative: 27.6 % (ref 12.0–46.0)
Lymphs Abs: 2.3 10*3/uL (ref 0.7–4.0)
MCHC: 34.6 g/dL (ref 30.0–36.0)
MCV: 92.7 fl (ref 78.0–100.0)
Monocytes Absolute: 0.6 10*3/uL (ref 0.1–1.0)
Monocytes Relative: 6.8 % (ref 3.0–12.0)
Neutro Abs: 4.9 10*3/uL (ref 1.4–7.7)
Neutrophils Relative %: 58.4 % (ref 43.0–77.0)
Platelets: 297 10*3/uL (ref 150.0–400.0)
RBC: 4.59 Mil/uL (ref 3.87–5.11)
RDW: 13.4 % (ref 11.5–15.5)
WBC: 8.4 10*3/uL (ref 4.0–10.5)

## 2021-12-28 LAB — BASIC METABOLIC PANEL
BUN: 14 mg/dL (ref 6–23)
CO2: 29 mEq/L (ref 19–32)
Calcium: 10 mg/dL (ref 8.4–10.5)
Chloride: 102 mEq/L (ref 96–112)
Creatinine, Ser: 0.82 mg/dL (ref 0.40–1.20)
GFR: 72.29 mL/min (ref >60.00)
Glucose, Bld: 92 mg/dL (ref 70–99)
Potassium: 4.2 mEq/L (ref 3.5–5.1)
Sodium: 139 mEq/L (ref 135–145)

## 2021-12-28 LAB — HEMOGLOBIN A1C: Hgb A1c MFr Bld: 5.2 % (ref 4.6–6.5)

## 2021-12-28 MED ORDER — ATORVASTATIN CALCIUM 20 MG PO TABS: 20.0000 mg | ORAL_TABLET | Freq: Every day | ORAL | 3 refills | Status: DC

## 2021-12-28 NOTE — Progress Notes (Signed)
   Subjective:    Patient ID: Robin Howe, female    DOB: Feb 19, 1951, 71 y.o.   MRN: [id number]  HPI Here to follow up on issues. She has no complaints today. Her BP is stable. Her GERD is well controlled. Her OA has been stable. She gets yearly mammograms.    Review of Systems  Constitutional: Negative.   HENT: Negative.    Eyes: Negative.   Respiratory: Negative.    Cardiovascular: Negative.   Gastrointestinal: Negative.   Genitourinary:  Negative for decreased urine volume, difficulty urinating, dyspareunia, dysuria, enuresis, flank pain, frequency, hematuria, pelvic pain and urgency.  Musculoskeletal:  Positive for arthralgias.  Skin: Negative.   Neurological: Negative.  Negative for headaches.  Psychiatric/Behavioral: Negative.         Objective:   Physical Exam Constitutional:      General: She is not in acute distress.    Appearance: Normal appearance. She is well-developed.  HENT:     Head: Normocephalic and atraumatic.     Right Ear: External ear normal.     Left Ear: External ear normal.     Nose: Nose normal.     Mouth/Throat:     Pharynx: No oropharyngeal exudate.  Eyes:     General: No scleral icterus.    Conjunctiva/sclera: Conjunctivae normal.     Pupils: Pupils are equal, round, and reactive to light.  Neck:     Thyroid: No thyromegaly.     Vascular: No JVD.  Cardiovascular:     Rate and Rhythm: Normal rate and regular rhythm.     Heart sounds: Normal heart sounds. No murmur heard.    No friction rub. No gallop.  Pulmonary:     Effort: Pulmonary effort is normal. No respiratory distress.     Breath sounds: Normal breath sounds. No wheezing or rales.  Chest:     Chest wall: No tenderness.  Abdominal:     General: Bowel sounds are normal. There is no distension.     Palpations: Abdomen is soft. There is no mass.     Tenderness: There is no abdominal tenderness. There is no guarding or rebound.  Musculoskeletal:        General: No tenderness.  Normal range of motion.     Cervical back: Normal range of motion and neck supple.  Lymphadenopathy:     Cervical: No cervical adenopathy.  Skin:    General: Skin is warm and dry.     Findings: No erythema or rash.  Neurological:     Mental Status: She is alert and oriented to person, place, and time.     Cranial Nerves: No cranial nerve deficit.     Motor: No abnormal muscle tone.     Coordination: Coordination normal.     Deep Tendon Reflexes: Reflexes are normal and symmetric. Reflexes normal.  Psychiatric:        Behavior: Behavior normal.        Thought Content: Thought content normal.        Judgment: Judgment normal.           Assessment & Plan:  She is doing well with HTN, GERD, OA, and neck pain. We will get fasting labs to check lipids, etc. We spent a total of ( 31  ) minutes reviewing records and discussing these issues.  [name], MD

## 2021-12-29 ENCOUNTER — Encounter: Payer: Self-pay | Admitting: Family Medicine

## 2021-12-29 NOTE — Telephone Encounter (Signed)
No problem. This will go up if the person is having any sort of allergy issues. We see it all the time

## 2022-01-01 ENCOUNTER — Other Ambulatory Visit: Payer: Self-pay | Admitting: Family Medicine

## 2022-01-16 ENCOUNTER — Other Ambulatory Visit: Payer: Self-pay | Admitting: Family Medicine

## 2022-01-17 NOTE — Telephone Encounter (Signed)
Pt LOV was on 12/28/21 Last refill was done on 07/06/21 Please advise

## 2022-02-07 DIAGNOSIS — M1711 Unilateral primary osteoarthritis, right knee: Secondary | ICD-10-CM | POA: Diagnosis not present

## 2022-02-07 DIAGNOSIS — M25561 Pain in right knee: Secondary | ICD-10-CM | POA: Diagnosis not present

## 2022-02-08 ENCOUNTER — Ambulatory Visit (INDEPENDENT_AMBULATORY_CARE_PROVIDER_SITE_OTHER): Payer: Medicare PPO

## 2022-02-08 VITALS — Ht 59.0 in | Wt 170.0 lb

## 2022-02-08 DIAGNOSIS — Z0000 Encounter for general adult medical examination without abnormal findings: Secondary | ICD-10-CM | POA: Diagnosis not present

## 2022-02-08 NOTE — Patient Instructions (Addendum)
Robin Howe , Thank you for taking time to come for your Medicare Wellness Visit. I appreciate your ongoing commitment to your health goals. Please review the following plan we discussed and let me know if I can assist you in the future.   These are the goals we discussed:  Goals       Patient Stated (pt-stated)      I will continue to walk 30 minutes every morning.      Patient Stated      Stay mobile and active        This is a list of the screening recommended for you and due dates:  Health Maintenance  Topic Date Due   COVID-19 Vaccine (7 - Pfizer series) 01/02/2023*   Zoster (Shingles) Vaccine (1 of 2) 01/02/2023*   Hepatitis C Screening: USPSTF Recommendation to screen - Ages 18-79 yo.  02/09/2023*   Medicare Annual Wellness Visit  02/09/2023   Tetanus Vaccine  11/23/2023   Mammogram  11/30/2023   Colon Cancer Screening  04/20/2026   Pneumonia Vaccine  Completed   Flu Shot  Completed   DEXA scan (bone density measurement)  Completed   HPV Vaccine  Aged Out  *Topic was postponed. The date shown is not the original due date.    Advanced directives: Please bring a copy of your health care power of attorney and living will to the office to be added to your chart at your convenience.   Conditions/risks identified: None  Next appointment: Follow up in one year for your annual wellness visit     Preventive Care 65 Years and Older, Female Preventive care refers to lifestyle choices and visits with your health care provider that can promote health and wellness. What does preventive care include? A yearly physical exam. This is also called an annual well check. Dental exams once or twice a year. Routine eye exams. Ask your health care provider how often you should have your eyes checked. Personal lifestyle choices, including: Daily care of your teeth and gums. Regular physical activity. Eating a healthy diet. Avoiding tobacco and drug use. Limiting alcohol  use. Practicing safe sex. Taking low-dose aspirin every day. Taking vitamin and mineral supplements as recommended by your health care provider. What happens during an annual well check? The services and screenings done by your health care provider during your annual well check will depend on your age, overall health, lifestyle risk factors, and family history of disease. Counseling  Your health care provider may ask you questions about your: Alcohol use. Tobacco use. Drug use. Emotional well-being. Home and relationship well-being. Sexual activity. Eating habits. History of falls. Memory and ability to understand (cognition). Work and work Statistician. Reproductive health. Screening  You may have the following tests or measurements: Height, weight, and BMI. Blood pressure. Lipid and cholesterol levels. These may be checked every 5 years, or more frequently if you are over 38 years old. Skin check. Lung cancer screening. You may have this screening every year starting at age 84 if you have a 30-pack-year history of smoking and currently smoke or have quit within the past 15 years. Fecal occult blood test (FOBT) of the stool. You may have this test every year starting at age 52. Flexible sigmoidoscopy or colonoscopy. You may have a sigmoidoscopy every 5 years or a colonoscopy every 10 years starting at age 72. Hepatitis C blood test. Hepatitis B blood test. Sexually transmitted disease (STD) testing. Diabetes screening. This is done by checking your blood  sugar (glucose) after you have not eaten for a while (fasting). You may have this done every 1-3 years. Bone density scan. This is done to screen for osteoporosis. You may have this done starting at age 54. Mammogram. This may be done every 1-2 years. Talk to your health care provider about how often you should have regular mammograms. Talk with your health care provider about your test results, treatment options, and if necessary,  the need for more tests. Vaccines  Your health care provider may recommend certain vaccines, such as: Influenza vaccine. This is recommended every year. Tetanus, diphtheria, and acellular pertussis (Tdap, Td) vaccine. You may need a Td booster every 10 years. Zoster vaccine. You may need this after age 10. Pneumococcal 13-valent conjugate (PCV13) vaccine. One dose is recommended after age 52. Pneumococcal polysaccharide (PPSV23) vaccine. One dose is recommended after age 26. Talk to your health care provider about which screenings and vaccines you need and how often you need them. This information is not intended to replace advice given to you by your health care provider. Make sure you discuss any questions you have with your health care provider. Document Released: 04/17/2015 Document Revised: 12/09/2015 Document Reviewed: 01/20/2015 Elsevier Interactive Patient Education  2017 [place] Prevention in the Home Falls can cause injuries. They can happen to people of all ages. There are many things you can do to make your home safe and to help prevent falls. What can I do on the outside of my home? Regularly fix the edges of walkways and driveways and fix any cracks. Remove anything that might make you trip as you walk through a door, such as a raised step or threshold. Trim any bushes or trees on the path to your home. Use bright outdoor lighting. Clear any walking paths of anything that might make someone trip, such as rocks or tools. Regularly check to see if handrails are loose or broken. Make sure that both sides of any steps have handrails. Any raised decks and porches should have guardrails on the edges. Have any leaves, snow, or ice cleared regularly. Use sand or salt on walking paths during winter. Clean up any spills in your garage right away. This includes oil or grease spills. What can I do in the bathroom? Use night lights. Install grab bars by the toilet and in the  tub and shower. Do not use towel bars as grab bars. Use non-skid mats or decals in the tub or shower. If you need to sit down in the shower, use a plastic, non-slip stool. Keep the floor dry. Clean up any water that spills on the floor as soon as it happens. Remove soap buildup in the tub or shower regularly. Attach bath mats securely with double-sided non-slip rug tape. Do not have throw rugs and other things on the floor that can make you trip. What can I do in the bedroom? Use night lights. Make sure that you have a light by your bed that is easy to reach. Do not use any sheets or blankets that are too big for your bed. They should not hang down onto the floor. Have a firm chair that has side arms. You can use this for support while you get dressed. Do not have throw rugs and other things on the floor that can make you trip. What can I do in the kitchen? Clean up any spills right away. Avoid walking on wet floors. Keep items that you use a lot in easy-to-reach  places. If you need to reach something above you, use a strong step stool that has a grab bar. Keep electrical cords out of the way. Do not use floor polish or wax that makes floors slippery. If you must use wax, use non-skid floor wax. Do not have throw rugs and other things on the floor that can make you trip. What can I do with my stairs? Do not leave any items on the stairs. Make sure that there are handrails on both sides of the stairs and use them. Fix handrails that are broken or loose. Make sure that handrails are as long as the stairways. Check any carpeting to make sure that it is firmly attached to the stairs. Fix any carpet that is loose or worn. Avoid having throw rugs at the top or bottom of the stairs. If you do have throw rugs, attach them to the floor with carpet tape. Make sure that you have a light switch at the top of the stairs and the bottom of the stairs. If you do not have them, ask someone to add them for  you. What else can I do to help prevent falls? Wear shoes that: Do not have high heels. Have rubber bottoms. Are comfortable and fit you well. Are closed at the toe. Do not wear sandals. If you use a stepladder: Make sure that it is fully opened. Do not climb a closed stepladder. Make sure that both sides of the stepladder are locked into place. Ask someone to hold it for you, if possible. Clearly mark and make sure that you can see: Any grab bars or handrails. First and last steps. Where the edge of each step is. Use tools that help you move around (mobility aids) if they are needed. These include: Canes. Walkers. Scooters. Crutches. Turn on the lights when you go into a dark area. Replace any light bulbs as soon as they burn out. Set up your furniture so you have a clear path. Avoid moving your furniture around. If any of your floors are uneven, fix them. If there are any pets around you, be aware of where they are. Review your medicines with your doctor. Some medicines can make you feel dizzy. This can increase your chance of falling. Ask your doctor what other things that you can do to help prevent falls. This information is not intended to replace advice given to you by your health care provider. Make sure you discuss any questions you have with your health care provider. Document Released: 01/15/2009 Document Revised: 08/27/2015 Document Reviewed: 04/25/2014 Elsevier Interactive Patient Education  2017 [employer].

## 2022-02-08 NOTE — Progress Notes (Signed)
Subjective:   Robin Howe is a 71 y.o. female who presents for Medicare Annual (Subsequent) preventive examination.  Review of Systems    Virtual Visit via Telephone Note  I connected with  [name] on 02/08/22 at  8:15 AM EST by telephone and verified that I am speaking with the correct person using two identifiers.  Location: Patient: Home Provider: Office Persons participating in the virtual visit: patient/Nurse Health Advisor   I discussed the limitations, risks, security and privacy concerns of performing an evaluation and management service by telephone and the availability of in person appointments. The patient expressed understanding and agreed to proceed.  Interactive audio and video telecommunications were attempted between this nurse and patient, however failed, due to patient having technical difficulties OR patient did not have access to video capability.  We continued and completed visit with audio only.  Some vital signs may be absent or patient reported.   Criselda Peaches, LPN  Cardiac Risk Factors include: advanced age (>37mn, >>6women);hypertension     Objective:    Today's Vitals   02/08/22 0821  Weight: 170 lb (77.1 kg)  Height: '4\' 11"'$  (1.499 m)   Body mass index is 34.34 kg/m.     02/08/2022    8:29 AM 01/20/2021    8:51 AM 12/19/2019    1:21 PM 04/01/2015    8:17 AM  Advanced Directives  Does Patient Have a Medical Advance Directive? Yes Yes Yes Yes  Type of AParamedicof [place]vey Ced[place]Living will Healthcare Power of [place] will [place] will  Does patient want to make changes to medical advance directive?   Yes (MAU/Ambulatory/Procedural Areas - Information given) No - Patient declined  Copy of HPowayin Chart? No - copy requested No - copy requested No - copy requested No - copy requested    Current Medications  (verified) Outpatient Encounter Medications as of 02/08/2022  Medication Sig   ALPRAZolam (XANAX) 0.5 MG tablet TAKE 1 TABLET (0.5 MG TOTAL) BY MOUTH AT BEDTIME AS NEEDED FOR SLEEP.   amLODipine (NORVASC) 5 MG tablet TAKE 1 TABLET BY MOUTH EVERY DAY   atorvastatin (LIPITOR) 20 MG tablet Take 1 tablet (20 mg total) by mouth daily.   Cholecalciferol (VITAMIN D3 PO) Take by mouth daily.   Cyanocobalamin (VITAMIN B-12 PO) Take by mouth daily.   ELDERBERRY PO Take by mouth daily.   Ferrous Sulfate (IRON PO) Take by mouth. Takes 10 days prior to giving blood   gabapentin (NEURONTIN) 100 MG capsule Take 100 mg by mouth as needed.   gabapentin (NEURONTIN) 300 MG capsule Take 1 capsule (300 mg total) by mouth at bedtime.   meloxicam (MOBIC) 15 MG tablet Take 1 tablet (15 mg total) by mouth daily.   valACYclovir (VALTREX) 1000 MG tablet Take 1 tablet (1,000 mg total) by mouth 3 (three) times daily.   [DISCONTINUED] hydrOXYzine (ATARAX) 10 MG tablet Take 1 tablet (10 mg total) by mouth every 6 (six) hours as needed for itching.   No facility-administered encounter medications on file as of 02/08/2022.    Allergies (verified) Bextra [valdecoxib] and Lisinopril   History: Past Medical History:  Diagnosis Date   Arthritis    Colon polyps    Depression    GERD (gastroesophageal reflux disease)    Hyperlipidemia    Hypertension    Irritable bowel syndrome (IBS)    Kidney stones    sees Dr.  Hessie Diener    Multiple lung nodules    both lung bases, appear benign, next scheduled CT will be march 2013   Past Surgical History:  Procedure Laterality Date   BREAST REDUCTION SURGERY     bilateral    CARPAL TUNNEL RELEASE     bilateral, per Dr. Cyndy Freeze   CATARACT EXTRACTION, BILATERAL Bilateral 2018   CESAREAN SECTION     CHOLECYSTECTOMY     COLONOSCOPY  04/20/2021   per Dr. Fuller Plan, adenomatous polyps, repeat in 5 yrs   LUMBAR FUSION  2006   per Dr. Christella Noa   LUMBAR LAMINECTOMY     Family  History  Problem Relation Age of Onset   Colon cancer Mother        36   Alzheimer's disease Sister    GER disease Son    Esophageal cancer Neg Hx    Rectal cancer Neg Hx    Stomach cancer Neg Hx    Social History   Socioeconomic History   Marital status: Widowed    Spouse name: Not on file   Number of children: 1   Years of education: Not on file   Highest education level: Bachelor's degree (e.g., BA, AB, BS)  Occupational History   Occupation: retired  Tobacco Use   Smoking status: Never   Smokeless tobacco: Never  Vaping Use   Vaping Use: Never used  Substance and Sexual Activity   Alcohol use: Yes    Alcohol/week: 0.0 standard drinks of alcohol    Comment: rare   Drug use: No   Sexual activity: Not on file  Other Topics Concern   Not on file  Social History Narrative   Not on file   Social Determinants of Health   Financial Resource Strain: Low Risk  (02/08/2022)   Overall Financial Resource Strain (CARDIA)    Difficulty of Paying Living Expenses: Not hard at all  Food Insecurity: No Food Insecurity (02/08/2022)   Hunger Vital Sign    Worried About Running Out of Food in the Last Year: Never true    Ran Out of Food in the Last Year: Never true  Transportation Needs: No Transportation Needs (02/08/2022)   PRAPARE - Hydrologist (Medical): No    Lack of Transportation (Non-Medical): No  Physical Activity: Insufficiently Active (02/08/2022)   Exercise Vital Sign    Days of Exercise per Week: 3 days    Minutes of Exercise per Session: 20 min  Stress: No Stress Concern Present (02/08/2022)   [place]    Feeling of Stress : Not at all  Social Connections: Moderately Integrated (02/08/2022)   Social Connection and Isolation Panel [NHANES]    Frequency of Communication with Friends and Family: More than three times a week    Frequency of Social Gatherings with Friends and  Family: More than three times a week    Attends Religious Services: More than 4 times per year    Active Member of [employer] or Organizations: Yes    Attends Archivist Meetings: More than 4 times per year    Marital Status: Widowed    Tobacco Counseling Counseling given: Not Answered   Clinical Intake:  Pre-visit preparation completed: Yes  Pain : No/denies pain     BMI - recorded: 34.34 Nutritional Status: BMI > 30  Obese Nutritional Risks: None Diabetes: No  How often do you need to have someone help you when you  read instructions, pamphlets, or other written materials from your doctor or pharmacy?: 1 - Never  Diabetic?  No  Interpreter Needed?: No  Information entered by :: Rolene Arbour LPN   Activities of Daily Living    02/08/2022    8:28 AM 02/04/2022   10:16 AM  In your present state of health, do you have any difficulty performing the following activities:  Hearing? 0 0  Vision? 0 0  Difficulty concentrating or making decisions? 0 0  Walking or climbing stairs? 0 0  Dressing or bathing? 0 0  Doing errands, shopping? 0 0  Preparing Food and eating ? N N  Using the Toilet? N N  In the past six months, have you accidently leaked urine? N N  Do you have problems with loss of bowel control? N N  Managing your Medications? N N  Managing your Finances? N N  Housekeeping or managing your Housekeeping? N N    Patient Care Team: Laurey Morale, MD as PCP - General  Indicate any recent Medical Services you may have received from other than [facility] providers in the past year (date may be approximate).     Assessment:   This is a routine wellness examination for Robin Howe.  Hearing/Vision screen Hearing Screening - Comments:: Denies hearing difficulties   Vision Screening - Comments:: Wears reading glasses - up to date with routine eye exams with  Dr Katy Fitch  Dietary issues and exercise activities discussed: Exercise limited by: None identified   Goals  Addressed               This Visit's Progress     Patient Stated (pt-stated)        I will continue to walk 30 minutes every morning.       Depression Screen    02/08/2022    8:27 AM 08/24/2021    2:06 PM 07/30/2021    1:42 PM 07/28/2021    2:12 PM 01/20/2021    8:50 AM 12/02/2020    9:41 AM 12/19/2019    1:25 PM  PHQ 2/9 Scores  PHQ - 2 Score 0 0 0 0 0 0 0  PHQ- 9 Score  '3 1 2  6 '$ 0    Fall Risk    02/04/2022   10:16 AM 08/24/2021    2:08 PM 08/24/2021    2:03 PM 07/30/2021    1:41 PM 07/28/2021    2:12 PM  [place] in the past year? 0 0 0 0 0  Number falls in past yr:  0 0 0 0  Injury with Fall?  0 0 0 0  Risk for fall due to :  No Fall Risks No Fall Risks  No Fall Risks  Follow up  Falls evaluation completed Falls evaluation completed  Falls evaluation completed    [place]:  Any stairs in or around the home? Yes  If so, are there any without handrails? No  Home free of loose throw rugs in walkways, pet beds, electrical cords, etc? Yes  Adequate lighting in your home to reduce risk of falls? Yes   ASSISTIVE DEVICES UTILIZED TO PREVENT FALLS:  Life alert? No  Use of a cane, walker or w/c? No  Grab bars in the bathroom? Yes  Shower chair or bench in shower? No  Elevated toilet seat or a handicapped toilet? No   TIMED UP AND GO:  Was the test performed? No . Audio Visit  Cognitive Function:        02/08/2022    8:29 AM  6CIT Screen  What Year? 0 points  What month? 0 points  What time? 0 points  Count back from 20 0 points  Months in reverse 0 points  Repeat phrase 0 points  Total Score 0 points    Immunizations Immunization History  Administered Date(s) Administered   Fluad Quad(high Dose 65+) 12/09/2018, 01/20/2020, 12/17/2021   Influenza Split 03/17/2011, 01/26/2012   Influenza Whole 02/21/2007, 01/10/2008, 01/12/2009, 01/13/2010   Influenza, High Dose Seasonal PF 01/06/2017, 12/08/2017,  02/12/2021   Influenza,inj,Quad PF,6+ Mos 01/25/2013, 11/22/2013, 12/19/2014   Influenza-Unspecified 12/30/2015, 01/06/2017, 11/10/2018   PFIZER Comirnaty(Gray Top)Covid-19 Tri-Sucrose Vaccine 10/23/2020   PFIZER(Purple Top)SARS-COV-2 Vaccination 05/14/2019, 06/11/2019, 01/06/2020, 10/23/2020   PPD Test 11/22/2013   Pfizer Covid-19 Vaccine Bivalent Booster 66yr & up 01/29/2021   Pneumococcal Conjugate-13 11/08/2018   Pneumococcal Polysaccharide-23 11/18/2019   Tdap 11/22/2013    TDAP status: Up to date  Flu Vaccine status: Up to date  Pneumococcal vaccine status: Up to date  Covid-19 vaccine status: Completed vaccines  Qualifies for Shingles Vaccine? Yes   Zostavax completed No   Shingrix Completed?: No.    Education has been provided regarding the importance of this vaccine. Patient has been advised to call insurance company to determine out of pocket expense if they have not yet received this vaccine. Advised may also receive vaccine at local pharmacy or Health Dept. Verbalized acceptance and understanding.  Screening Tests Health Maintenance  Topic Date Due   COVID-19 Vaccine (7 - Pfizer series) 01/02/2023 (Originally 06/01/2021)   Zoster Vaccines- Shingrix (1 of 2) 01/02/2023 (Originally 03/19/2001)   Hepatitis C Screening  02/09/2023 (Originally 03/19/1969)   Medicare Annual Wellness (AWV)  02/09/2023   TETANUS/TDAP  11/23/2023   MAMMOGRAM  11/30/2023   COLONOSCOPY (Pts 45-414yrInsurance coverage will need to be confirmed)  04/20/2026   Pneumonia Vaccine 6562Years old  Completed   INFLUENZA VACCINE  Completed   DEXA SCAN  Completed   HPV VACCINES  Aged Out    Health Maintenance  There are no preventive care reminders to display for this patient.   Colorectal cancer screening: Type of screening: Colonoscopy. Completed 04/20/21. Repeat every 5 years  Mammogram status: Completed 11/29/21. Repeat every year  Bone Density status: Completed 09/05/17. Results reflect: Bone  density results: OSTEOPOROSIS. Repeat every   years.  Lung Cancer Screening: (Low Dose CT Chest recommended if Age 71-80ears, 30 pack-year currently smoking OR have quit w/in 15years.) does not qualify.    Additional Screening:  Hepatitis C Screening: does qualify; Completed Deferred  Vision Screening: Recommended annual ophthalmology exams for early detection of glaucoma and other disorders of the eye. Is the patient up to date with their annual eye exam?  Yes  Who is the provider or what is the name of the office in which the patient attends annual eye exams? Dr GrKaty Fitchf pt is not established with a provider, would they like to be referred to a provider to establish care? No .   Dental Screening: Recommended annual dental exams for proper oral hygiene  Community Resource Referral / Chronic Care Management:  CRR required this visit?  No   CCM required this visit?  No      Plan:     I have personally reviewed and noted the following in the patient's chart:   Medical and social history Use of alcohol, tobacco or illicit drugs  Current medications  and supplements including opioid prescriptions. Patient is not currently taking opioid prescriptions. Functional ability and status Nutritional status Physical activity Advanced directives List of other physicians Hospitalizations, surgeries, and ER visits in previous 12 months Vitals Screenings to include cognitive, depression, and falls Referrals and appointments  In addition, I have reviewed and discussed with patient certain preventive protocols, quality metrics, and best practice recommendations. A written personalized care plan for preventive services as well as general preventive health recommendations were provided to patient.     Criselda Peaches, LPN   [id number]   Nurse Notes: Patient due Hep-C Screening

## 2022-02-15 DIAGNOSIS — M1711 Unilateral primary osteoarthritis, right knee: Secondary | ICD-10-CM | POA: Diagnosis not present

## 2022-02-15 DIAGNOSIS — M25561 Pain in right knee: Secondary | ICD-10-CM | POA: Diagnosis not present

## 2022-02-21 ENCOUNTER — Encounter: Payer: Self-pay | Admitting: Family Medicine

## 2022-02-21 NOTE — Telephone Encounter (Signed)
Let's just stop the Gabapentin completely for a few weeks and see how she does

## 2022-03-08 ENCOUNTER — Encounter: Payer: Self-pay | Admitting: Family Medicine

## 2022-03-08 NOTE — Telephone Encounter (Signed)
Its hard to say. I suggest she make an OV to address this

## 2022-03-18 ENCOUNTER — Other Ambulatory Visit: Payer: Self-pay | Admitting: Internal Medicine

## 2022-03-18 ENCOUNTER — Other Ambulatory Visit: Payer: Self-pay | Admitting: Family Medicine

## 2022-03-18 DIAGNOSIS — B0229 Other postherpetic nervous system involvement: Secondary | ICD-10-CM

## 2022-03-23 ENCOUNTER — Encounter: Payer: Self-pay | Admitting: Family Medicine

## 2022-03-23 ENCOUNTER — Other Ambulatory Visit: Payer: Self-pay

## 2022-03-23 DIAGNOSIS — M159 Polyosteoarthritis, unspecified: Secondary | ICD-10-CM

## 2022-03-23 MED ORDER — MELOXICAM 15 MG PO TABS: 15.0000 mg | ORAL_TABLET | Freq: Every day | ORAL | 2 refills | Status: DC

## 2022-04-11 DIAGNOSIS — M1711 Unilateral primary osteoarthritis, right knee: Secondary | ICD-10-CM | POA: Diagnosis not present

## 2022-04-20 ENCOUNTER — Encounter: Payer: Self-pay | Admitting: Family Medicine

## 2022-04-20 DIAGNOSIS — M47812 Spondylosis without myelopathy or radiculopathy, cervical region: Secondary | ICD-10-CM | POA: Diagnosis not present

## 2022-05-23 DIAGNOSIS — M47812 Spondylosis without myelopathy or radiculopathy, cervical region: Secondary | ICD-10-CM | POA: Diagnosis not present

## 2022-06-03 ENCOUNTER — Other Ambulatory Visit: Payer: Self-pay | Admitting: Internal Medicine

## 2022-06-03 DIAGNOSIS — B0229 Other postherpetic nervous system involvement: Secondary | ICD-10-CM

## 2022-06-08 ENCOUNTER — Encounter: Payer: Self-pay | Admitting: Family Medicine

## 2022-06-08 NOTE — Telephone Encounter (Signed)
She can certainly stop this if she wants to

## 2022-06-09 DIAGNOSIS — M7051 Other bursitis of knee, right knee: Secondary | ICD-10-CM | POA: Diagnosis not present

## 2022-06-09 DIAGNOSIS — M1711 Unilateral primary osteoarthritis, right knee: Secondary | ICD-10-CM | POA: Diagnosis not present

## 2022-06-13 ENCOUNTER — Encounter: Payer: Self-pay | Admitting: Family Medicine

## 2022-06-14 ENCOUNTER — Other Ambulatory Visit: Payer: Self-pay

## 2022-06-14 DIAGNOSIS — M159 Polyosteoarthritis, unspecified: Secondary | ICD-10-CM

## 2022-06-14 MED ORDER — MELOXICAM 15 MG PO TABS: 15.0000 mg | ORAL_TABLET | Freq: Every day | ORAL | 1 refills | Status: DC

## 2022-06-14 NOTE — Telephone Encounter (Signed)
Call in Meloxicam 15 mg daily, #90 with 3 rf  

## 2022-06-30 DIAGNOSIS — M1711 Unilateral primary osteoarthritis, right knee: Secondary | ICD-10-CM | POA: Diagnosis not present

## 2022-07-07 DIAGNOSIS — M1711 Unilateral primary osteoarthritis, right knee: Secondary | ICD-10-CM | POA: Diagnosis not present

## 2022-07-11 ENCOUNTER — Ambulatory Visit
Admission: RE | Admit: 2022-07-11 | Discharge: 2022-07-11 | Disposition: A | Discharge status: Home / Self Care | Payer: Medicare PPO | Source: Ambulatory Visit | Attending: Internal Medicine | Admitting: Internal Medicine

## 2022-07-11 VITALS — BP 118/71 | HR 80 | Temp 98.0°F | Resp 16

## 2022-07-11 DIAGNOSIS — J309 Allergic rhinitis, unspecified: Secondary | ICD-10-CM

## 2022-07-11 DIAGNOSIS — R051 Acute cough: Secondary | ICD-10-CM

## 2022-07-11 MED ORDER — BENZONATATE 100 MG PO CAPS: 100.0000 mg | ORAL_CAPSULE | Freq: Three times a day (TID) | ORAL | 0 refills | Status: DC

## 2022-07-11 MED ORDER — OLOPATADINE HCL 0.1 % OP SOLN: 1.0000 [drp] | Freq: Two times a day (BID) | OPHTHALMIC | 1 refills | Status: DC

## 2022-07-11 MED ORDER — FLUTICASONE PROPIONATE 50 MCG/ACT NA SUSP: 1.0000 | Freq: Every day | NASAL | 0 refills | Status: DC

## 2022-07-11 NOTE — ED Triage Notes (Signed)
Pt c/o cough since ~ thurs not relieved with mucinex. Denies pain in triage. Associated headache from coughing.

## 2022-07-11 NOTE — ED Provider Notes (Signed)
[facility]    CSN: [id number] Arrival date & time: 07/11/22  0808      History   Chief Complaint Chief Complaint  Patient presents with   Cough    chest congestion with cough.  No fever. - Entered by patient    HPI Robin Howe is a 72 y.o. female.   Patient presents to urgent care for evaluation of cough for the last 5 days since Thursday, July 07, 2022.  Cough is dry, nonproductive, and much worse at nighttime.  Denies orthopnea, leg swelling, shortness of breath, chest pain, heart palpitations, nausea, vomiting, abdominal pain, acid reflux, dizziness, fever/chills, nasal congestion, ear pain, sore throat, and vision changes.  She does report a mild generalized headache associated with coughing.  Denies history of chronic respiratory problems including asthma/COPD.  She smoked for short period of time when she was in college and greater than 50 years ago but denies further drug/smoking use.  No recent known sick contacts with similar symptoms.  She takes Claritin daily for intermittent itching.  States she has worn a mask consistently for the last 2 years and recently started taking her mask off.  She is unsure if she was exposed to a viral illness or if she is experiencing seasonal allergies. Reports increased sneezing and intermittent watery/itchy eyes as well. Has not attempted use of any other over the counter medications to help with symptoms.    Cough   Past Medical History:  Diagnosis Date   Arthritis    Colon polyps    Depression    GERD (gastroesophageal reflux disease)    Hyperlipidemia    Hypertension    Irritable bowel syndrome (IBS)    Kidney stones    sees Dr. Larey Dresser    Multiple lung nodules    both lung bases, appear benign, next scheduled CT will be march 2013    Patient Active Problem List   Diagnosis Date Noted   Shingles of eyelid 07/30/2021   Eye pain, right 07/30/2021   Chronic headaches 01/04/2021   Chronic neck pain  04/24/2019   Insomnia 08/23/2016   Allergic rhinitis 11/22/2013   HTN (hypertension) 06/04/2013   NEPHROLITHIASIS 01/13/2010   LUNG NODULE 12/25/2009   ABDOMINAL PAIN, RIGHT LOWER QUADRANT 12/25/2009   MORBID OBESITY 01/10/2008   Dyslipidemia 06/18/2007   Osteoarthritis 06/18/2007   DEPRESSION, ACUTE, RECURRENT 05/18/2007   GERD 01/10/2007    Past Surgical History:  Procedure Laterality Date   BREAST REDUCTION SURGERY     bilateral    CARPAL TUNNEL RELEASE     bilateral, per Dr. Mikal Plane   CATARACT EXTRACTION, BILATERAL Bilateral 2018   CESAREAN SECTION     CHOLECYSTECTOMY     COLONOSCOPY  04/20/2021   per Dr. Russella Dar, adenomatous polyps, repeat in 5 yrs   LUMBAR FUSION  2006   per Dr. Franky Macho   LUMBAR LAMINECTOMY      OB History   No obstetric history on file.      Home Medications    Prior to Admission medications   Medication Sig Start Date End Date Taking? Authorizing Provider  benzonatate (TESSALON) 100 MG capsule Take 1 capsule (100 mg total) by mouth every 8 (eight) hours. 07/11/22  Yes Carlisle Beers, FNP  fluticasone (FLONASE) 50 MCG/ACT nasal spray Place 1 spray into both nostrils daily. 07/11/22  Yes Carlisle Beers, FNP  olopatadine (PATANOL) 0.1 % ophthalmic solution Place 1 drop into both eyes 2 (two) times daily. 07/11/22  Yes Carlisle Beers, FNP  ALPRAZolam Prudy Feeler) 0.5 MG tablet TAKE 1 TABLET (0.5 MG TOTAL) BY MOUTH AT BEDTIME AS NEEDED FOR SLEEP. 01/17/22   Nelwyn Salisbury, MD  amLODipine (NORVASC) 5 MG tablet TAKE 1 TABLET BY MOUTH EVERY DAY 03/18/22   Nelwyn Salisbury, MD  atorvastatin (LIPITOR) 20 MG tablet Take 1 tablet (20 mg total) by mouth daily. 12/28/21   Nelwyn Salisbury, MD  Cholecalciferol (VITAMIN D3 PO) Take by mouth daily.    [provider]  Cyanocobalamin (VITAMIN B-12 PO) Take by mouth daily.    [provider]  ELDERBERRY PO Take by mouth daily.    [provider]  Ferrous Sulfate (IRON PO) Take by  mouth. Takes 10 days prior to giving blood    [provider]  gabapentin (NEURONTIN) 100 MG capsule Take 100 mg by mouth as needed.    [provider]  gabapentin (NEURONTIN) 300 MG capsule TAKE 1 CAPSULE BY MOUTH EVERYDAY AT BEDTIME 06/03/22   Philip Aspen, Limmie Patricia, MD  meloxicam (MOBIC) 15 MG tablet Take 1 tablet (15 mg total) by mouth daily. 06/14/22   Nelwyn Salisbury, MD  valACYclovir (VALTREX) 1000 MG tablet Take 1 tablet (1,000 mg total) by mouth 3 (three) times daily. 07/28/21   Nelwyn Salisbury, MD    Family History Family History  Problem Relation Age of Onset   Colon cancer Mother        92   Alzheimer's disease Sister    GER disease Son    Esophageal cancer Neg Hx    Rectal cancer Neg Hx    Stomach cancer Neg Hx     Social History Social History   Tobacco Use   Smoking status: Never   Smokeless tobacco: Never  Vaping Use   Vaping Use: Never used  Substance Use Topics   Alcohol use: Yes    Alcohol/week: 0.0 standard drinks of alcohol    Comment: rare   Drug use: No     Allergies   Bextra [valdecoxib] and Lisinopril   Review of Systems Review of Systems  Respiratory:  Positive for cough.   Per HPI   Physical Exam Triage Vital Signs ED Triage Vitals  Enc Vitals Group     BP 07/11/22 0911 118/71     Pulse Rate 07/11/22 0910 80     Resp 07/11/22 0910 16     Temp 07/11/22 0910 98 F (36.7 C)     Temp Source 07/11/22 0910 Oral     SpO2 07/11/22 0910 95 %     Weight --      Height --      Head Circumference --      Peak Flow --      Pain Score 07/11/22 0910 0     Pain Loc --      Pain Edu? --      Excl. in GC? --    No data found.  Updated Vital Signs BP 118/71   Pulse 80   Temp 98 F (36.7 C) (Oral)   Resp 16   SpO2 95%   Visual Acuity Right Eye Distance:   Left Eye Distance:   Bilateral Distance:    Right Eye Near:   Left Eye Near:    Bilateral Near:     Physical Exam Vitals and nursing note reviewed.   Constitutional:      Appearance: She is not ill-appearing or toxic-appearing.  HENT:  Head: Normocephalic and atraumatic.     Right Ear: Hearing, tympanic membrane, ear canal and external ear normal.     Left Ear: Hearing, tympanic membrane, ear canal and external ear normal.     Nose: Rhinorrhea present.     Mouth/Throat:     Lips: Pink.     Mouth: Mucous membranes are moist. No injury.     Tongue: No lesions. Tongue does not deviate from midline.     Palate: No mass and lesions.     Pharynx: Oropharynx is clear. Uvula midline. No pharyngeal swelling, oropharyngeal exudate, posterior oropharyngeal erythema or uvula swelling.     Tonsils: No tonsillar exudate or tonsillar abscesses.     Comments: Small amount of clear postnasal drainage visualized to the posterior oropharynx.  Eyes:     General: Lids are normal. Vision grossly intact. Gaze aligned appropriately.     Extraocular Movements: Extraocular movements intact.     Conjunctiva/sclera: Conjunctivae normal.  Cardiovascular:     Rate and Rhythm: Normal rate and regular rhythm.     Heart sounds: Normal heart sounds, S1 normal and S2 normal.  Pulmonary:     Effort: Pulmonary effort is normal. No respiratory distress.     Breath sounds: Normal breath sounds and air entry.  Musculoskeletal:     Cervical back: Neck supple.  Lymphadenopathy:     Cervical: No cervical adenopathy.  Skin:    General: Skin is warm and dry.     Capillary Refill: Capillary refill takes less than 2 seconds.     Findings: No rash.  Neurological:     General: No focal deficit present.     Mental Status: She is alert and oriented to person, place, and time. Mental status is at baseline.     Cranial Nerves: No dysarthria or facial asymmetry.  Psychiatric:        Mood and Affect: Mood normal.        Speech: Speech normal.        Behavior: Behavior normal.        Thought Content: Thought content normal.        Judgment: Judgment normal.      UC  Treatments / Results  Labs (all labs ordered are listed, but only abnormal results are displayed) Labs Reviewed - No data to display  EKG   Radiology No results found.  Procedures Procedures (including critical care time)  Medications Ordered in UC Medications - No data to display  Initial Impression / Assessment and Plan / UC Course  I have reviewed the triage vital signs and the nursing notes.  Pertinent labs & imaging results that were available during my care of the patient were reviewed by me and considered in my medical decision making (see chart for details).   1. Allergic rhinitis, acute cough Presentation consistent with acute cough due to allergic rhinitis. Lungs clear, oxygenating well on room air, therefore deferred imaging of the chest. Overall well appearing with hemodynamically stable vital signs. Low suspicion for viral etiology. Claritin once daily continued. Flonase as prescribed. Olopatadine as needed for allergic conjunctivitis. Tessalon perles every 8 hours as needed for cough. Advised to follow-up with PCP as needed for further evaluation.   Discussed physical exam and available lab work findings in clinic with patient.  Counseled patient regarding appropriate use of medications and potential side effects for all medications recommended or prescribed today. Discussed red flag signs and symptoms of worsening condition,when to call the PCP office, return to  urgent care, and when to seek higher level of care in the emergency department. Patient verbalizes understanding and agreement with plan. All questions answered. Patient discharged in stable condition.    Final Clinical Impressions(s) / UC Diagnoses   Final diagnoses:  Allergic rhinitis, unspecified seasonality, unspecified trigger  Acute cough     Discharge Instructions      Your symptoms are likely due to environmental allergies.  Avoid exposure to allergens. Take oral antihistamine (Either Zyrtec,  Claritin, or Allegra) and use Flonase daily as directed. You can buy these medications over the counter. These medications can take a few days to fully kick in to your body and start working. Return for any new or worsening symptoms.  If your eyes become itchy, you may purchase olopatadine (Pataday) eyedrops over the counter and use as directed to relieve watery/itchy eyes associated with allergies as well.   If your symptoms are severe, please go to the emergency room for further evaluation. Schedule an appointment with your primary care provider for follow-up and further management of your seasonal allergies as well as ongoing preventive healthcare. I hope you feel better!      ED Prescriptions     Medication Sig Dispense Auth. Provider   benzonatate (TESSALON) 100 MG capsule Take 1 capsule (100 mg total) by mouth every 8 (eight) hours. 21 capsule Reita May M, FNP   olopatadine (PATANOL) 0.1 % ophthalmic solution Place 1 drop into both eyes 2 (two) times daily. 5 mL Reita May M, FNP   fluticasone [facility]) 50 MCG/ACT nasal spray Place 1 spray into both nostrils daily. 16 g Carlisle Beers, FNP      PDMP not reviewed this encounter.   Reita May [place], [place] 07/11/22 [phone]

## 2022-07-11 NOTE — Discharge Instructions (Addendum)
Your symptoms are likely due to environmental allergies.  Avoid exposure to allergens. Take oral antihistamine (Either Zyrtec, Claritin, or Allegra) and use Flonase daily as directed. You can buy these medications over the counter. These medications can take a few days to fully kick in to your body and start working. Return for any new or worsening symptoms.  If your eyes become itchy, you may purchase olopatadine (Pataday) eyedrops over the counter and use as directed to relieve watery/itchy eyes associated with allergies as well.   If your symptoms are severe, please go to the emergency room for further evaluation. Schedule an appointment with your primary care provider for follow-up and further management of your seasonal allergies as well as ongoing preventive healthcare. I hope you feel better!   

## 2022-07-13 ENCOUNTER — Ambulatory Visit: Payer: Medicare PPO | Admitting: Family Medicine

## 2022-07-13 ENCOUNTER — Encounter: Payer: Self-pay | Admitting: Family Medicine

## 2022-07-13 VITALS — BP 138/66 | HR 88 | Temp 98.1°F | Wt 177.2 lb

## 2022-07-13 DIAGNOSIS — J069 Acute upper respiratory infection, unspecified: Secondary | ICD-10-CM

## 2022-07-13 DIAGNOSIS — R059 Cough, unspecified: Secondary | ICD-10-CM | POA: Diagnosis not present

## 2022-07-13 LAB — POC COVID19 BINAXNOW: SARS Coronavirus 2 Ag: NEGATIVE

## 2022-07-13 MED ORDER — HYDROCODONE BIT-HOMATROP MBR 5-1.5 MG/5ML PO SOLN: 5.0000 mL | ORAL | 0 refills | Status: DC | PRN

## 2022-07-13 MED ORDER — METHYLPREDNISOLONE 4 MG PO TBPK
ORAL_TABLET | ORAL | 0 refills | Status: DC
Start: 2022-07-13 — End: 2023-01-11

## 2022-07-13 NOTE — Progress Notes (Signed)
   Subjective:    Patient ID: Robin Howe, female    DOB: March 23, 1951, 72 y.o.   MRN: [id number]  HPI Here for one week of a hard dry cough that keeps her up at night. No SOB or fever. No sinus congestion or ST. She went to urgent care on 07-11-22, and she was given Benzonatate capsules. These have not helped at all.    Review of Systems  Constitutional: Negative.   HENT: Negative.    Eyes: Negative.   Respiratory:  Positive for cough. Negative for shortness of breath and wheezing.   Cardiovascular: Negative.        Objective:   Physical Exam Constitutional:      Appearance: Normal appearance. She is not ill-appearing.  HENT:     Right Ear: Tympanic membrane, ear canal and external ear normal.     Left Ear: Tympanic membrane, ear canal and external ear normal.     Nose: Nose normal.     Mouth/Throat:     Pharynx: Oropharynx is clear.  Eyes:     Conjunctiva/sclera: Conjunctivae normal.  Pulmonary:     Effort: Pulmonary effort is normal.     Breath sounds: Normal breath sounds.  Lymphadenopathy:     Cervical: No cervical adenopathy.  Neurological:     Mental Status: She is alert.           Assessment & Plan:  Viral URI. Treat with a Medrol dose pack, and she can use Hycodan for sleep. Gershon Crane, MD

## 2022-07-18 DIAGNOSIS — M1711 Unilateral primary osteoarthritis, right knee: Secondary | ICD-10-CM | POA: Diagnosis not present

## 2022-08-07 ENCOUNTER — Other Ambulatory Visit: Payer: Self-pay | Admitting: Family Medicine

## 2022-08-14 ENCOUNTER — Other Ambulatory Visit: Payer: Self-pay | Admitting: Family Medicine

## 2022-08-15 DIAGNOSIS — Z961 Presence of intraocular lens: Secondary | ICD-10-CM | POA: Diagnosis not present

## 2022-09-16 ENCOUNTER — Encounter: Payer: Self-pay | Admitting: Family Medicine

## 2022-10-07 ENCOUNTER — Other Ambulatory Visit: Payer: Self-pay | Admitting: Internal Medicine

## 2022-10-07 ENCOUNTER — Other Ambulatory Visit: Payer: Self-pay | Admitting: Family Medicine

## 2022-10-07 DIAGNOSIS — M159 Polyosteoarthritis, unspecified: Secondary | ICD-10-CM

## 2022-10-07 DIAGNOSIS — B0229 Other postherpetic nervous system involvement: Secondary | ICD-10-CM

## 2022-10-10 ENCOUNTER — Encounter: Payer: Self-pay | Admitting: Family Medicine

## 2022-10-11 ENCOUNTER — Encounter: Payer: Self-pay | Admitting: Family Medicine

## 2022-10-11 DIAGNOSIS — B0229 Other postherpetic nervous system involvement: Secondary | ICD-10-CM

## 2022-10-11 MED ORDER — GABAPENTIN 100 MG PO CAPS: 100.0000 mg | ORAL_CAPSULE | Freq: Three times a day (TID) | ORAL | 5 refills | Status: DC

## 2022-10-11 NOTE — Telephone Encounter (Signed)
I sent in a new RX with refills

## 2022-10-12 MED ORDER — GABAPENTIN 300 MG PO CAPS: ORAL_CAPSULE | ORAL | 1 refills | Status: DC

## 2022-10-12 NOTE — Telephone Encounter (Signed)
I sent in for the 300 mg dose and cancelled the order for the 100 mg dose

## 2022-10-14 MED ORDER — GABAPENTIN 300 MG PO CAPS: ORAL_CAPSULE | ORAL | 1 refills | Status: DC

## 2022-10-14 NOTE — Telephone Encounter (Signed)
Done

## 2022-11-13 ENCOUNTER — Other Ambulatory Visit: Payer: Self-pay | Admitting: Family Medicine

## 2022-11-15 ENCOUNTER — Encounter: Payer: Self-pay | Admitting: Family Medicine

## 2022-11-15 NOTE — Telephone Encounter (Signed)
They should be coming out with a new Covid vaccine any time now. She can check with her pharmacist

## 2022-11-25 DIAGNOSIS — M47812 Spondylosis without myelopathy or radiculopathy, cervical region: Secondary | ICD-10-CM | POA: Diagnosis not present

## 2022-12-06 DIAGNOSIS — Z1231 Encounter for screening mammogram for malignant neoplasm of breast: Secondary | ICD-10-CM | POA: Diagnosis not present

## 2022-12-06 LAB — HM MAMMOGRAPHY

## 2022-12-08 ENCOUNTER — Encounter: Payer: Self-pay | Admitting: Family Medicine

## 2022-12-12 ENCOUNTER — Other Ambulatory Visit: Payer: Self-pay | Admitting: Family Medicine

## 2022-12-15 ENCOUNTER — Encounter: Payer: Self-pay | Admitting: Family Medicine

## 2022-12-19 NOTE — Telephone Encounter (Signed)
COVID Vaccine was documented in pt chart

## 2022-12-21 ENCOUNTER — Encounter: Payer: Self-pay | Admitting: Family Medicine

## 2022-12-21 DIAGNOSIS — M47812 Spondylosis without myelopathy or radiculopathy, cervical region: Secondary | ICD-10-CM | POA: Diagnosis not present

## 2022-12-22 NOTE — Telephone Encounter (Signed)
This will temporarily make the Hgb drop, but nothing else should be affected

## 2022-12-26 NOTE — Telephone Encounter (Signed)
Yes she can donate

## 2022-12-30 DIAGNOSIS — M1711 Unilateral primary osteoarthritis, right knee: Secondary | ICD-10-CM | POA: Diagnosis not present

## 2023-01-11 ENCOUNTER — Encounter: Payer: Self-pay | Admitting: Family Medicine

## 2023-01-11 ENCOUNTER — Ambulatory Visit: Payer: Medicare PPO | Admitting: Family Medicine

## 2023-01-11 VITALS — BP 128/68 | HR 81 | Temp 98.3°F | Ht 59.0 in | Wt 173.4 lb

## 2023-01-11 DIAGNOSIS — G8929 Other chronic pain: Secondary | ICD-10-CM

## 2023-01-11 DIAGNOSIS — R519 Headache, unspecified: Secondary | ICD-10-CM

## 2023-01-11 DIAGNOSIS — I10 Essential (primary) hypertension: Secondary | ICD-10-CM | POA: Diagnosis not present

## 2023-01-11 DIAGNOSIS — R739 Hyperglycemia, unspecified: Secondary | ICD-10-CM

## 2023-01-11 DIAGNOSIS — K219 Gastro-esophageal reflux disease without esophagitis: Secondary | ICD-10-CM | POA: Diagnosis not present

## 2023-01-11 DIAGNOSIS — E785 Hyperlipidemia, unspecified: Secondary | ICD-10-CM | POA: Diagnosis not present

## 2023-01-11 DIAGNOSIS — G4700 Insomnia, unspecified: Secondary | ICD-10-CM | POA: Diagnosis not present

## 2023-01-11 DIAGNOSIS — M542 Cervicalgia: Secondary | ICD-10-CM | POA: Diagnosis not present

## 2023-01-11 DIAGNOSIS — M150 Primary generalized (osteo)arthritis: Secondary | ICD-10-CM

## 2023-01-11 LAB — TSH: TSH: 1.97 u[IU]/mL (ref 0.35–5.50)

## 2023-01-11 LAB — BASIC METABOLIC PANEL
BUN: 23 mg/dL (ref 6–23)
CO2: 28 meq/L (ref 19–32)
Calcium: 10.3 mg/dL (ref 8.4–10.5)
Chloride: 101 meq/L (ref 96–112)
Creatinine, Ser: 0.79 mg/dL (ref 0.40–1.20)
GFR: 75.05 mL/min (ref >60.00)
Glucose, Bld: 102 mg/dL — ABNORMAL HIGH (ref 70–99)
Potassium: 4.7 meq/L (ref 3.5–5.1)
Sodium: 140 meq/L (ref 135–145)

## 2023-01-11 LAB — CBC WITH DIFFERENTIAL/PLATELET
Basophils Absolute: 0.1 10*3/uL (ref 0.0–0.1)
Basophils Relative: 0.7 % (ref 0.0–3.0)
Eosinophils Absolute: 0.3 10*3/uL (ref 0.0–0.7)
Eosinophils Relative: 3.7 % (ref 0.0–5.0)
HCT: 37.7 % (ref 36.0–46.0)
Hemoglobin: 12.4 g/dL (ref 12.0–15.0)
Lymphocytes Relative: 23.2 % (ref 12.0–46.0)
Lymphs Abs: 2.1 10*3/uL (ref 0.7–4.0)
MCHC: 33 g/dL (ref 30.0–36.0)
MCV: 93.8 fL (ref 78.0–100.0)
Monocytes Absolute: 0.5 10*3/uL (ref 0.1–1.0)
Monocytes Relative: 5.6 % (ref 3.0–12.0)
Neutro Abs: 6.1 10*3/uL (ref 1.4–7.7)
Neutrophils Relative %: 66.8 % (ref 43.0–77.0)
Platelets: 273 10*3/uL (ref 150.0–400.0)
RBC: 4.02 Mil/uL (ref 3.87–5.11)
RDW: 13.7 % (ref 11.5–15.5)
WBC: 9.2 10*3/uL (ref 4.0–10.5)

## 2023-01-11 LAB — LIPID PANEL
Cholesterol: 155 mg/dL (ref 0–200)
HDL: 59.7 mg/dL (ref >39.00)
LDL Cholesterol: 72 mg/dL (ref 0–99)
NonHDL: 94.9
Total CHOL/HDL Ratio: 3
Triglycerides: 114 mg/dL (ref 0.0–149.0)
VLDL: 22.8 mg/dL (ref 0.0–40.0)

## 2023-01-11 LAB — HEPATIC FUNCTION PANEL
ALT: 18 U/L (ref 0–35)
AST: 25 U/L (ref 0–37)
Albumin: 4.4 g/dL (ref 3.5–5.2)
Alkaline Phosphatase: 89 U/L (ref 39–117)
Bilirubin, Direct: 0.1 mg/dL (ref 0.0–0.3)
Total Bilirubin: 0.5 mg/dL (ref 0.2–1.2)
Total Protein: 7.1 g/dL (ref 6.0–8.3)

## 2023-01-11 LAB — HEMOGLOBIN A1C: Hgb A1c MFr Bld: 5.3 % (ref 4.6–6.5)

## 2023-01-11 NOTE — Progress Notes (Signed)
Subjective:    Patient ID: [name], female    DOB: 26-Nov-1950, 72 y.o.   MRN: [id number]  HPI Here to follow up on issues. Her main concern today is her weight. She has tried dieting and exercise to no avail. Otherwise her OA is stable. Her neck pain has improved slightly after she had 2 nerve ablations. Her headaches are stable. Her HTN is well controlled. She sleeps well now that she takes Gabapentin.   Review of Systems  Constitutional: Negative.   HENT: Negative.    Eyes: Negative.   Respiratory: Negative.    Cardiovascular: Negative.   Gastrointestinal: Negative.   Genitourinary:  Negative for decreased urine volume, difficulty urinating, dyspareunia, dysuria, enuresis, flank pain, frequency, hematuria, pelvic pain and urgency.  Musculoskeletal:  Positive for arthralgias and neck pain.  Skin: Negative.   Neurological: Negative.  Negative for headaches.  Psychiatric/Behavioral: Negative.         Objective:   Physical Exam Constitutional:      General: She is not in acute distress.    Appearance: She is well-developed. She is obese.  HENT:     Head: Normocephalic and atraumatic.     Right Ear: External ear normal.     Left Ear: External ear normal.     Nose: Nose normal.     Mouth/Throat:     Pharynx: No oropharyngeal exudate.  Eyes:     General: No scleral icterus.    Conjunctiva/sclera: Conjunctivae normal.     Pupils: Pupils are equal, round, and reactive to light.  Neck:     Thyroid: No thyromegaly.     Vascular: No JVD.  Cardiovascular:     Rate and Rhythm: Normal rate and regular rhythm.     Pulses: Normal pulses.     Heart sounds: Normal heart sounds. No murmur heard.    No friction rub. No gallop.  Pulmonary:     Effort: Pulmonary effort is normal. No respiratory distress.     Breath sounds: Normal breath sounds. No wheezing or rales.  Chest:     Chest wall: No tenderness.  Abdominal:     General: Bowel sounds are normal. There is no  distension.     Palpations: Abdomen is soft. There is no mass.     Tenderness: There is no abdominal tenderness. There is no guarding or rebound.  Musculoskeletal:        General: No tenderness. Normal range of motion.     Cervical back: Normal range of motion and neck supple.  Lymphadenopathy:     Cervical: No cervical adenopathy.  Skin:    General: Skin is warm and dry.     Findings: No erythema or rash.  Neurological:     General: No focal deficit present.     Mental Status: She is alert and oriented to person, place, and time.     Cranial Nerves: No cranial nerve deficit.     Motor: No abnormal muscle tone.     Coordination: Coordination normal.     Deep Tendon Reflexes: Reflexes are normal and symmetric. Reflexes normal.  Psychiatric:        Mood and Affect: Mood normal.        Behavior: Behavior normal.        Thought Content: Thought content normal.        Judgment: Judgment normal.           Assessment & Plan:  Her OA and HTN are stable. Her neck  pain has improved. Her insomnia is better. Her GERD and headaches are stable. Get fasting labs to check lipids, etc. For her obesity, we will refer her to the Weight Management clinic. We spent a total of ( 35  ) minutes reviewing records and discussing these issues.  Gershon Crane, MD

## 2023-01-19 NOTE — Telephone Encounter (Signed)
Reviewed lab results with pt verbalized undestanding

## 2023-01-30 DIAGNOSIS — Z0289 Encounter for other administrative examinations: Secondary | ICD-10-CM

## 2023-01-31 ENCOUNTER — Ambulatory Visit (INDEPENDENT_AMBULATORY_CARE_PROVIDER_SITE_OTHER): Payer: Medicare PPO | Admitting: Nurse Practitioner

## 2023-01-31 ENCOUNTER — Encounter: Payer: Self-pay | Admitting: Nurse Practitioner

## 2023-01-31 VITALS — BP 139/76 | HR 79 | Temp 98.0°F | Ht 59.0 in | Wt 170.0 lb

## 2023-01-31 DIAGNOSIS — I10 Essential (primary) hypertension: Secondary | ICD-10-CM

## 2023-01-31 DIAGNOSIS — E669 Obesity, unspecified: Secondary | ICD-10-CM | POA: Diagnosis not present

## 2023-01-31 DIAGNOSIS — Z6834 Body mass index (BMI) 34.0-34.9, adult: Secondary | ICD-10-CM | POA: Diagnosis not present

## 2023-01-31 DIAGNOSIS — E785 Hyperlipidemia, unspecified: Secondary | ICD-10-CM

## 2023-01-31 NOTE — Progress Notes (Signed)
Office: [phone]  /  Fax: [phone]   Initial Visit  Robin Howe was seen in clinic today to evaluate for obesity. She is interested in losing weight to improve overall health and reduce the risk of weight related complications. She presents today to review program treatment options, initial physical assessment, and evaluation.     She was referred by: PCP  When asked what else they would like to accomplish? She states: Adopt healthier eating patterns, Improve energy levels and physical activity, Improve existing medical conditions, Improve quality of life, and Lose a target amount of weight : 20 lbs  Weight history:  She started gaining weight when she was a child.  Her weight has fluctuated over the years.   When asked how has your weight affected you? She states: Contributed to orthopedic problems or mobility issues, Having fatigue, and Having poor endurance  Some associated conditions: HTN, allergies, lung nodule, GERD, OA, chronic headaches, neck pain, dyslipidemia, depression, HLD  Contributing factors: Family history, Stress, Eating patterns, Hx of pregnancies, and Menopause  Weight promoting medications identified: None  Current nutrition plan: None  Current level of physical activity: swimming 3 days per week  Current or previous pharmacotherapy: HCG, Vit B12, Phentermine  Response to medication: Lost weight initially but was unable to sustain weight loss   Past medical history includes:   Past Medical History:  Diagnosis Date   Arthritis    Colon polyps    Depression    GERD (gastroesophageal reflux disease)    Hyperlipidemia    Hypertension    Irritable bowel syndrome (IBS)    Kidney stones    sees Dr. Larey Howe    Multiple lung nodules    both lung bases, appear benign, next scheduled CT will be march 2013     Objective:   BP 139/76   Pulse 79   Temp 98 F (36.7 C)   Ht 4\' 11"  (1.499 m)   Wt 170 lb (77.1 kg)   SpO2 98%   BMI 34.34  kg/m  She was weighed on the bioimpedance scale: Body mass index is 34.34 kg/m.  Peak Weight:193 lbs , Body Fat%:45.7, Visceral Fat Rating:14, Weight trend over the last 12 months: fluctuated   General:  Alert, oriented and cooperative. Patient is in no acute distress.  Respiratory: Normal respiratory effort, no problems with respiration noted   Gait: able to ambulate independently  Mental Status: Normal mood and affect. Normal behavior. Normal judgment and thought content.   DIAGNOSTIC DATA REVIEWED:  BMET    Component Value Date/Time   NA 140 01/11/2023 1031   K 4.7 01/11/2023 1031   CL 101 01/11/2023 1031   CO2 28 01/11/2023 1031   GLUCOSE 102 (H) 01/11/2023 1031   GLUCOSE 99 02/20/2006 0836   BUN 23 01/11/2023 1031   CREATININE 0.79 01/11/2023 1031   CREATININE 0.71 11/18/2019 1334   CALCIUM 10.3 01/11/2023 1031   GFRNONAA >60 12/31/2010 0032   GFRAA >60 12/31/2010 0032   Lab Results  Component Value Date   HGBA1C 5.3 01/11/2023   HGBA1C 5.3 12/02/2020   No results found for: "INSULIN" CBC    Component Value Date/Time   WBC 9.2 01/11/2023 1031   RBC 4.02 01/11/2023 1031   HGB 12.4 01/11/2023 1031   HCT 37.7 01/11/2023 1031   PLT 273.0 01/11/2023 1031   MCV 93.8 01/11/2023 1031   MCH 28.3 11/18/2019 1334   MCHC 33.0 01/11/2023 1031   RDW 13.7 01/11/2023 1031  Iron/TIBC/Ferritin/ %Sat No results found for: "IRON", "TIBC", "FERRITIN", "IRONPCTSAT" Lipid Panel     Component Value Date/Time   CHOL 155 01/11/2023 1031   TRIG 114.0 01/11/2023 1031   TRIG 55 02/20/2006 0836   HDL 59.70 01/11/2023 1031   CHOLHDL 3 01/11/2023 1031   VLDL 22.8 01/11/2023 1031   LDLCALC 72 01/11/2023 1031   LDLCALC 86 11/18/2019 1334   LDLDIRECT 155.7 02/20/2006 0836   Hepatic Function Panel     Component Value Date/Time   PROT 7.1 01/11/2023 1031   ALBUMIN 4.4 01/11/2023 1031   AST 25 01/11/2023 1031   ALT 18 01/11/2023 1031   ALKPHOS 89 01/11/2023 1031   BILITOT 0.5  01/11/2023 1031   BILIDIR 0.1 01/11/2023 1031   IBILI 0.4 11/18/2019 1334      Component Value Date/Time   TSH 1.97 01/11/2023 1031     Assessment and Plan:   Dyslipidemia Continue to follow up with PCP.  Continue meds as directed.  Primary hypertension Continue to follow up with PCP.  Continue meds as directed.  Generalized obesity  BMI 34.0-34.9,adult        Obesity Treatment / Action Plan:  Patient will work on garnering support from family and friends to begin weight loss journey. Will work on eliminating or reducing the presence of highly palatable, calorie dense foods in the home. Will complete provided nutritional and psychosocial assessment questionnaire before the next appointment. Will be scheduled for indirect calorimetry to determine resting energy expenditure in a fasting state.  This will allow [place] to create a reduced calorie, high-protein meal plan to promote loss of fat mass while preserving muscle mass. Counseled on the health benefits of losing 5%-15% of total body weight. Was counseled on nutritional approaches to weight loss and benefits of reducing processed foods and consuming plant-based foods and high quality protein as part of nutritional weight management. Was counseled on pharmacotherapy and role as an adjunct in weight management.   Obesity Education Performed Today:  She was weighed on the bioimpedance scale and results were discussed and documented in the synopsis.  We discussed obesity as a disease and the importance of a more detailed evaluation of all the factors contributing to the disease.  We discussed the importance of long term lifestyle changes which include nutrition, exercise and behavioral modifications as well as the importance of customizing this to her specific health and social needs.  We discussed the benefits of reaching a healthier weight to alleviate the symptoms of existing conditions and reduce the risks of the  biomechanical, metabolic and psychological effects of obesity.  Robin Howe appears to be in the action stage of change and states they are ready to start intensive lifestyle modifications and behavioral modifications.  30 minutes was spent today on this visit including the above counseling, pre-visit chart review, and post-visit documentation.  Reviewed by clinician on day of visit: allergies, medications, problem list, medical history, surgical history, family history, social history, and previous encounter notes pertinent to obesity diagnosis.    Theodis Sato Skylier Kretschmer FNP-C

## 2023-02-12 ENCOUNTER — Other Ambulatory Visit: Payer: Self-pay | Admitting: Family Medicine

## 2023-02-14 ENCOUNTER — Other Ambulatory Visit: Payer: Self-pay | Admitting: Family Medicine

## 2023-02-14 ENCOUNTER — Encounter: Payer: Medicare PPO | Admitting: Family Medicine

## 2023-02-16 ENCOUNTER — Encounter: Payer: Self-pay | Admitting: Family Medicine

## 2023-02-16 ENCOUNTER — Encounter (INDEPENDENT_AMBULATORY_CARE_PROVIDER_SITE_OTHER): Payer: Self-pay | Admitting: *Deleted

## 2023-02-16 NOTE — Telephone Encounter (Signed)
Pt LOV was 01/11/2023

## 2023-02-17 MED ORDER — ALPRAZOLAM 0.5 MG PO TABS: 0.5000 mg | ORAL_TABLET | Freq: Every evening | ORAL | 5 refills | Status: DC | PRN

## 2023-02-17 NOTE — Telephone Encounter (Signed)
Refill was sent in 

## 2023-02-17 NOTE — Telephone Encounter (Signed)
Pt LOV was on 01/11/23 Last refill done on 5/7.24 Please advise

## 2023-02-20 ENCOUNTER — Ambulatory Visit (INDEPENDENT_AMBULATORY_CARE_PROVIDER_SITE_OTHER): Payer: Medicare PPO

## 2023-02-20 ENCOUNTER — Telehealth: Payer: Self-pay | Admitting: Family Medicine

## 2023-02-20 ENCOUNTER — Encounter (INDEPENDENT_AMBULATORY_CARE_PROVIDER_SITE_OTHER): Payer: Self-pay

## 2023-02-20 VITALS — Ht 59.0 in | Wt 170.0 lb

## 2023-02-20 DIAGNOSIS — Z0000 Encounter for general adult medical examination without abnormal findings: Secondary | ICD-10-CM

## 2023-02-20 NOTE — Patient Instructions (Addendum)
Robin Howe , Thank you for taking time to come for your Medicare Wellness Visit. I appreciate your ongoing commitment to your health goals. Please review the following plan we discussed and let me know if I can assist you in the future.   Referrals/Orders/Follow-Ups/Clinician Recommendations:   This is a list of the screening recommended for you and due dates:  Health Maintenance  Topic Date Due   Hepatitis C Screening  Never done   Zoster (Shingles) Vaccine (2 of 2) 11/11/2022   COVID-19 Vaccine (8 - 2023-24 season) 02/09/2023   DTaP/Tdap/Td vaccine (2 - Td or Tdap) 11/23/2023   Medicare Annual Wellness Visit  02/20/2024   Mammogram  12/05/2024   Colon Cancer Screening  04/20/2026   Pneumonia Vaccine  Completed   Flu Shot  Completed   DEXA scan (bone density measurement)  Completed   HPV Vaccine  Aged Out    Advanced directives: (Copy Requested) Please bring a copy of your health care power of attorney and living will to the office to be added to your chart at your convenience.  Next Medicare Annual Wellness Visit scheduled for next year: Yes

## 2023-02-20 NOTE — Telephone Encounter (Signed)
Patient called stating she tested positive for COVID on Saturday and is wanting to know if there is anything that can be prescribed for her and what she can do.  Please advise at [phone].

## 2023-02-20 NOTE — Progress Notes (Signed)
Subjective:   Robin Howe is a 72 y.o. female who presents for Medicare Annual (Subsequent) preventive examination.  Visit Complete: Virtual I connected with  Robin Howe on 02/20/23 by a audio enabled telemedicine application and verified that I am speaking with the correct person using two identifiers.  Patient Location: Home  Provider Location: Home Office  I discussed the limitations of evaluation and management by telemedicine. The patient expressed understanding and agreed to proceed.  Vital Signs: Because this visit was a virtual/telehealth visit, some criteria may be missing or patient reported. Any vitals not documented were not able to be obtained and vitals that have been documented are patient reported.  Patient Medicare AWV questionnaire was completed by the patient on 02/16/23 I have confirmed that all information answered by patient is correct and no changes since this date.  Cardiac Risk Factors include: advanced age (>19men, >50 women);hypertension;dyslipidemia     Objective:    Today's Vitals   02/20/23 1507  Weight: 170 lb (77.1 kg)  Height: 4\' 11"  (1.499 m)   Body mass index is 34.34 kg/m.     02/20/2023    3:13 PM 02/08/2022    8:29 AM 01/20/2021    8:51 AM 12/19/2019    1:21 PM 04/01/2015    8:17 AM  Advanced Directives  Does Patient Have a Medical Advance Directive? Yes Yes Yes Yes Yes  Type of Estate agent of [place];Living will Healthcare Power of [place];Living will Healthcare Power of [employer] of [place];Living will Healthcare Power of [place];Living will  Does patient want to make changes to medical advance directive?    Yes (MAU/Ambulatory/Procedural Areas - Information given) No - Patient declined  Copy of Healthcare Power of Attorney in Chart? No - copy requested No - copy requested No - copy requested No - copy requested No - copy requested    Current Medications (verified) Outpatient  Encounter Medications as of 02/20/2023  Medication Sig   ALPRAZolam (XANAX) 0.5 MG tablet Take 1 tablet (0.5 mg total) by mouth at bedtime as needed for sleep.   amLODipine (NORVASC) 5 MG tablet TAKE 1 TABLET BY MOUTH EVERY DAY   atorvastatin (LIPITOR) 20 MG tablet TAKE 1 TABLET BY MOUTH EVERY DAY   Cholecalciferol (VITAMIN D3 PO) Take by mouth daily.   Cyanocobalamin (VITAMIN B-12 PO) Take by mouth daily.   Ferrous Sulfate (IRON PO) Take by mouth. Takes 10 days prior to giving blood   gabapentin (NEURONTIN) 300 MG capsule TAKE 1 CAPSULE BY MOUTH EVERYDAY AT BEDTIME   meloxicam (MOBIC) 15 MG tablet TAKE 1 TABLET (15 MG TOTAL) BY MOUTH DAILY.   [DISCONTINUED] ELDERBERRY PO Take by mouth daily.   [DISCONTINUED] olopatadine (PATANOL) 0.1 % ophthalmic solution Place 1 drop into both eyes 2 (two) times daily.   No facility-administered encounter medications on file as of 02/20/2023.    Allergies (verified) Bextra [valdecoxib] and Lisinopril   History: Past Medical History:  Diagnosis Date   Arthritis    Colon polyps    Depression    GERD (gastroesophageal reflux disease)    Hyperlipidemia    Hypertension    Irritable bowel syndrome (IBS)    Kidney stones    sees Dr. Larey Dresser    Multiple lung nodules    both lung bases, appear benign, next scheduled CT will be march 2013   Past Surgical History:  Procedure Laterality Date   BREAST REDUCTION SURGERY     bilateral  CARPAL TUNNEL RELEASE     bilateral, per Dr. Mikal Plane   CATARACT EXTRACTION, BILATERAL Bilateral 2018   CESAREAN SECTION     CHOLECYSTECTOMY     COLONOSCOPY  04/20/2021   per Dr. Russella Dar, adenomatous polyps, repeat in 5 yrs   LUMBAR FUSION  2006   per Dr. Franky Macho   LUMBAR LAMINECTOMY     Family History  Problem Relation Age of Onset   Colon cancer Mother        25   Alzheimer's disease Sister    GER disease Son    Esophageal cancer Neg Hx    Rectal cancer Neg Hx    Stomach cancer Neg Hx    Social  History   Socioeconomic History   Marital status: Widowed    Spouse name: Not on file   Number of children: 1   Years of education: Not on file   Highest education level: Bachelor's degree (e.g., BA, AB, BS)  Occupational History   Occupation: retired  Tobacco Use   Smoking status: Never   Smokeless tobacco: Never  Vaping Use   Vaping status: Never Used  Substance and Sexual Activity   Alcohol use: Yes    Alcohol/week: 0.0 standard drinks of alcohol    Comment: rare   Drug use: No   Sexual activity: Not on file  Other Topics Concern   Not on file  Social History Narrative   Not on file   Social Determinants of Health   Financial Resource Strain: Low Risk  (02/16/2023)   Overall Financial Resource Strain (CARDIA)    Difficulty of Paying Living Expenses: Not hard at all  Food Insecurity: No Food Insecurity (02/16/2023)   Hunger Vital Sign    Worried About Running Out of Food in the Last Year: Never true    Ran Out of Food in the Last Year: Never true  Transportation Needs: No Transportation Needs (02/16/2023)   PRAPARE - Administrator, Civil Service (Medical): No    Lack of Transportation (Non-Medical): No  Physical Activity: Sufficiently Active (02/16/2023)   Exercise Vital Sign    Days of Exercise per Week: 3 days    Minutes of Exercise per Session: 50 min  Stress: No Stress Concern Present (02/16/2023)   [employer] of Occupational Health - Occupational Stress Questionnaire    Feeling of Stress : Only a little  Social Connections: Moderately Integrated (02/16/2023)   Social Connection and Isolation Panel [NHANES]    Frequency of Communication with Friends and Family: More than three times a week    Frequency of Social Gatherings with Friends and Family: More than three times a week    Attends Religious Services: More than 4 times per year    Active Member of [employer] or Organizations: Yes    Attends Banker Meetings: More than 4 times  per year    Marital Status: Widowed    Tobacco Counseling Counseling given: Not Answered   Clinical Intake:  Pre-visit preparation completed: Yes  Pain : No/denies pain     BMI - recorded: 34.34 Nutritional Status: BMI > 30  Obese Nutritional Risks: None Diabetes: No  How often do you need to have someone help you when you read instructions, pamphlets, or other written materials from your doctor or pharmacy?: 1 - Never  Interpreter Needed?: No  Information entered by :: Theresa Mulligan LPN   Activities of Daily Living    02/16/2023    9:13 AM 02/10/2023  9:31 AM  In your present state of health, do you have any difficulty performing the following activities:  Hearing? 0 0  Vision? 0 0  Difficulty concentrating or making decisions? 0 0  Walking or climbing stairs? 0 0  Dressing or bathing? 0 0  Doing errands, shopping? 0 0  Preparing Food and eating ? N N  Using the Toilet? N N  In the past six months, have you accidently leaked urine? N N  Do you have problems with loss of bowel control? N N  Managing your Medications? N N  Managing your Finances? N N  Housekeeping or managing your Housekeeping? N N    Patient Care Team: Nelwyn Salisbury, MD as PCP - General  Indicate any recent Medical Services you may have received from other than [facility] providers in the past year (date may be approximate).     Assessment:   This is a routine wellness examination for Robin Howe.  Hearing/Vision screen Hearing Screening - Comments:: Denies hearing difficulties   Vision Screening - Comments:: Wears rx glasses - up to date with routine eye exams with  Dr Dione Booze   Goals Addressed               This Visit's Progress     Patient Stated (pt-stated)        Stay mobile and active!       Depression Screen    02/20/2023    3:21 PM 07/13/2022    2:32 PM 02/08/2022    8:27 AM 08/24/2021    2:06 PM 07/30/2021    1:42 PM 07/28/2021    2:12 PM 01/20/2021    8:50 AM  PHQ 2/9  Scores  PHQ - 2 Score 0 0 0 0 0 0 0  PHQ- 9 Score  0  3 1 2      Fall Risk    02/20/2023    3:12 PM 02/16/2023    9:13 AM 02/10/2023    9:31 AM 07/13/2022    2:32 PM 02/04/2022   10:16 AM  Fall Risk   Falls in the past year? 0 0 0 0 0  Number falls in past yr: 0   0   Injury with Fall? 0   0   Risk for fall due to : No Fall Risks   No Fall Risks   Follow up Falls prevention discussed   Falls evaluation completed     MEDICARE RISK AT HOME: Medicare Risk at Home Any stairs in or around the home?: No If so, are there any without handrails?: No Home free of loose throw rugs in walkways, pet beds, electrical cords, etc?: No Adequate lighting in your home to reduce risk of falls?: Yes Life alert?: No Use of a cane, walker or w/c?: No Grab bars in the bathroom?: Yes Shower chair or bench in shower?: Yes Elevated toilet seat or a handicapped toilet?: No  TIMED UP AND GO:  Was the test performed?  No    Cognitive Function:        02/20/2023    3:13 PM 02/08/2022    8:29 AM  6CIT Screen  What Year? 0 points 0 points  What month? 0 points 0 points  What time? 0 points 0 points  Count back from 20 0 points 0 points  Months in reverse 0 points 0 points  Repeat phrase 0 points 0 points  Total Score 0 points 0 points    Immunizations Immunization History  Administered Date(s) Administered  Fluad Quad(high Dose 65+) 12/09/2018, 01/20/2020, 12/17/2021   Influenza Split 03/17/2011, 01/26/2012   Influenza Whole 02/21/2007, 01/10/2008, 01/12/2009, 01/13/2010   Influenza, High Dose Seasonal PF 01/06/2017, 12/08/2017, 02/12/2021   Influenza,inj,Quad PF,6+ Mos 01/25/2013, 11/22/2013, 12/19/2014   Influenza-Unspecified 12/30/2015, 01/06/2017, 11/10/2018   PFIZER Comirnaty(Gray Top)Covid-19 Tri-Sucrose Vaccine 10/23/2020   PFIZER(Purple Top)SARS-COV-2 Vaccination 05/14/2019, 06/11/2019, 01/06/2020, 10/23/2020   PPD Test 11/22/2013   Pfizer Covid-19 Vaccine Bivalent Booster 70yrs &  up 01/29/2021, 12/15/2022   Pneumococcal Conjugate-13 11/08/2018   Pneumococcal Polysaccharide-23 11/18/2019   Tdap 11/22/2013   Zoster Recombinant(Shingrix) 09/16/2022    TDAP status: Up to date  Flu Vaccine status: Up to date  Pneumococcal vaccine status: Up to date  Covid-19 vaccine status: Declined, Education has been provided regarding the importance of this vaccine but patient still declined. Advised may receive this vaccine at local pharmacy or Health Dept.or vaccine clinic. Aware to provide a copy of the vaccination record if obtained from local pharmacy or Health Dept. Verbalized acceptance and understanding.  Qualifies for Shingles Vaccine? Yes   Zostavax completed Yes   Shingrix Completed?: Yes  Screening Tests Health Maintenance  Topic Date Due   Hepatitis C Screening  Never done   Zoster Vaccines- Shingrix (2 of 2) 11/11/2022   COVID-19 Vaccine (8 - 2023-24 season) 02/09/2023   DTaP/Tdap/Td (2 - Td or Tdap) 11/23/2023   Medicare Annual Wellness (AWV)  02/20/2024   MAMMOGRAM  12/05/2024   Colonoscopy  04/20/2026   Pneumonia Vaccine 56+ Years old  Completed   INFLUENZA VACCINE  Completed   DEXA SCAN  Completed   HPV VACCINES  Aged Out    Health Maintenance  Health Maintenance Due  Topic Date Due   Hepatitis C Screening  Never done   Zoster Vaccines- Shingrix (2 of 2) 11/11/2022   COVID-19 Vaccine (8 - 2023-24 season) 02/09/2023    Colorectal cancer screening: Type of screening: Colonoscopy. Completed 04/20/21. Repeat every 5 years  Mammogram status: Completed 12/06/22. Repeat every year  Bone Density status: Completed 09/05/17. Results reflect: Bone density results: OSTEOPOROSIS. Repeat every   years.     Additional Screening:  Hepatitis C Screening: does qualify;  Deferred  Vision Screening: Recommended annual ophthalmology exams for early detection of glaucoma and other disorders of the eye. Is the patient up to date with their annual eye exam?  Yes   Who is the provider or what is the name of the office in which the patient attends annual eye exams? Dr Dione Booze If pt is not established with a provider, would they like to be referred to a provider to establish care? No .   Dental Screening: Recommended annual dental exams for proper oral hygiene    Community Resource Referral / Chronic Care Management:  CRR required this visit?  No   CCM required this visit?  No     Plan:     I have personally reviewed and noted the following in the patient's chart:   Medical and social history Use of alcohol, tobacco or illicit drugs  Current medications and supplements including opioid prescriptions. Patient is not currently taking opioid prescriptions. Functional ability and status Nutritional status Physical activity Advanced directives List of other physicians Hospitalizations, surgeries, and ER visits in previous 12 months Vitals Screenings to include cognitive, depression, and falls Referrals and appointments  In addition, I have reviewed and discussed with patient certain preventive protocols, quality metrics, and best practice recommendations. A written personalized care plan for preventive services as well  as general preventive health recommendations were provided to patient.     Tillie Rung, LPN   [id number]   After Visit Summary: (MyChart) Due to this being a telephonic visit, the after visit summary with patients personalized plan was offered to patient via MyChart   Nurse Notes: None

## 2023-02-21 ENCOUNTER — Encounter: Payer: Self-pay | Admitting: Family Medicine

## 2023-02-21 ENCOUNTER — Ambulatory Visit (INDEPENDENT_AMBULATORY_CARE_PROVIDER_SITE_OTHER): Payer: Self-pay | Admitting: Family Medicine

## 2023-02-22 NOTE — Telephone Encounter (Signed)
FYI Pt has a virtual appointment tomorrow morning.

## 2023-02-23 ENCOUNTER — Telehealth: Payer: Medicare PPO | Admitting: Family Medicine

## 2023-02-23 ENCOUNTER — Encounter: Payer: Self-pay | Admitting: Family Medicine

## 2023-02-23 DIAGNOSIS — U071 COVID-19: Secondary | ICD-10-CM

## 2023-02-23 NOTE — Progress Notes (Signed)
Subjective:    Patient ID: Robin Howe, female    DOB: [birth year], 72 y.o.   MRN: [id number]  HPI Virtual Visit via Video Note  I connected with the patient on 02/23/23 at  8:30 AM EST by a video enabled telemedicine application and verified that I am speaking with the correct person using two identifiers.  Location patient: home Location provider:work or home office Persons participating in the virtual visit: patient, provider  I discussed the limitations of evaluation and management by telemedicine and the availability of in person appointments. The patient expressed understanding and agreed to proceed.   HPI: Here for a Covid infection. 5 days ago she developed a fever with body aches and a dry cough. No SOB. Today she feels much better, but she tested positive for Covid yesterday and she wanted to discuss this with [place]. She is drinking fluids and taking Mucinex.    ROS: See pertinent positives and negatives per HPI.  Past Medical History:  Diagnosis Date   Arthritis    Colon polyps    Depression    GERD (gastroesophageal reflux disease)    Hyperlipidemia    Hypertension    Irritable bowel syndrome (IBS)    Kidney stones    sees Dr. Larey Dresser    Multiple lung nodules    both lung bases, appear benign, next scheduled CT will be march 2013    Past Surgical History:  Procedure Laterality Date   BREAST REDUCTION SURGERY     bilateral    CARPAL TUNNEL RELEASE     bilateral, per Dr. Mikal Plane   CATARACT EXTRACTION, BILATERAL Bilateral 2018   CESAREAN SECTION     CHOLECYSTECTOMY     COLONOSCOPY  04/20/2021   per Dr. Russella Dar, adenomatous polyps, repeat in 5 yrs   LUMBAR FUSION  2006   per Dr. Franky Macho   LUMBAR LAMINECTOMY      Family History  Problem Relation Age of Onset   Colon cancer Mother        91   Alzheimer's disease Sister    GER disease Son    Esophageal cancer Neg Hx    Rectal cancer Neg Hx    Stomach cancer Neg Hx      Current Outpatient  Medications:    ALPRAZolam (XANAX) 0.5 MG tablet, Take 1 tablet (0.5 mg total) by mouth at bedtime as needed for sleep., Disp: 30 tablet, Rfl: 5   amLODipine (NORVASC) 5 MG tablet, TAKE 1 TABLET BY MOUTH EVERY DAY, Disp: 90 tablet, Rfl: 0   atorvastatin (LIPITOR) 20 MG tablet, TAKE 1 TABLET BY MOUTH EVERY DAY, Disp: 90 tablet, Rfl: 3   Cholecalciferol (VITAMIN D3 PO), Take by mouth daily., Disp: , Rfl:    Cyanocobalamin (VITAMIN B-12 PO), Take by mouth daily., Disp: , Rfl:    Ferrous Sulfate (IRON PO), Take by mouth. Takes 10 days prior to giving blood, Disp: , Rfl:    gabapentin (NEURONTIN) 300 MG capsule, TAKE 1 CAPSULE BY MOUTH EVERYDAY AT BEDTIME, Disp: 90 capsule, Rfl: 1   meloxicam (MOBIC) 15 MG tablet, TAKE 1 TABLET (15 MG TOTAL) BY MOUTH DAILY., Disp: 90 tablet, Rfl: 1  EXAM:  VITALS per patient if applicable:  GENERAL: alert, oriented, appears well and in no acute distress  HEENT: atraumatic, conjunttiva clear, no obvious abnormalities on inspection of external nose and ears  NECK: normal movements of the head and neck  LUNGS: on inspection no signs of respiratory distress, breathing rate appears normal,  no obvious gross SOB, gasping or wheezing  CV: no obvious cyanosis  MS: moves all visible extremities without noticeable abnormality  PSYCH/NEURO: pleasant and cooperative, no obvious depression or anxiety, speech and thought processing grossly intact  ASSESSMENT AND PLAN: Covid infection. She seems to be over the worst of it, and now she is in the recovery stage. We agreed that she does not need an antiviral medication. Recheck as needed.  Gershon Crane, MD  Discussed the following assessment and plan:  No diagnosis found.     I discussed the assessment and treatment plan with the patient. The patient was provided an opportunity to ask questions and all were answered. The patient agreed with the plan and demonstrated an understanding of the instructions.   The patient  was advised to call back or seek an in-person evaluation if the symptoms worsen or if the condition fails to improve as anticipated.      Review of Systems     Objective:   Physical Exam        Assessment & Plan:

## 2023-02-27 ENCOUNTER — Encounter: Payer: Self-pay | Admitting: Family Medicine

## 2023-02-28 NOTE — Telephone Encounter (Signed)
I do not have any specific suggestions. She will need to wait this out. The fatigue can last long after the other symptoms resolve

## 2023-03-01 ENCOUNTER — Encounter: Payer: Self-pay | Admitting: Emergency Medicine

## 2023-03-01 ENCOUNTER — Emergency Department (HOSPITAL_COMMUNITY)
Admission: EM | Admit: 2023-03-01 | Discharge: 2023-03-01 | Disposition: A | Discharge status: Home / Self Care | Payer: Medicare PPO | Attending: Emergency Medicine | Admitting: Emergency Medicine

## 2023-03-01 ENCOUNTER — Other Ambulatory Visit: Payer: Self-pay

## 2023-03-01 ENCOUNTER — Encounter: Payer: Self-pay | Admitting: Family Medicine

## 2023-03-01 ENCOUNTER — Emergency Department (HOSPITAL_COMMUNITY): Payer: Medicare PPO

## 2023-03-01 ENCOUNTER — Telehealth: Payer: Self-pay | Admitting: Family Medicine

## 2023-03-01 ENCOUNTER — Ambulatory Visit
Admission: EM | Admit: 2023-03-01 | Discharge: 2023-03-01 | Disposition: A | Discharge status: Home / Self Care | Payer: Medicare PPO | Attending: Physician Assistant | Admitting: Physician Assistant

## 2023-03-01 ENCOUNTER — Encounter (HOSPITAL_COMMUNITY): Payer: Self-pay

## 2023-03-01 DIAGNOSIS — R309 Painful micturition, unspecified: Secondary | ICD-10-CM | POA: Diagnosis not present

## 2023-03-01 DIAGNOSIS — R300 Dysuria: Secondary | ICD-10-CM | POA: Diagnosis not present

## 2023-03-01 DIAGNOSIS — R002 Palpitations: Secondary | ICD-10-CM | POA: Diagnosis not present

## 2023-03-01 DIAGNOSIS — R630 Anorexia: Secondary | ICD-10-CM | POA: Diagnosis not present

## 2023-03-01 DIAGNOSIS — R531 Weakness: Secondary | ICD-10-CM | POA: Diagnosis not present

## 2023-03-01 DIAGNOSIS — I10 Essential (primary) hypertension: Secondary | ICD-10-CM | POA: Diagnosis not present

## 2023-03-01 DIAGNOSIS — U071 COVID-19: Secondary | ICD-10-CM | POA: Diagnosis not present

## 2023-03-01 LAB — COMPREHENSIVE METABOLIC PANEL
ALT: 17 U/L (ref 0–44)
AST: 28 U/L (ref 15–41)
Albumin: 4.3 g/dL (ref 3.5–5.0)
Alkaline Phosphatase: 90 U/L (ref 38–126)
Anion gap: 11 (ref 5–15)
BUN: 16 mg/dL (ref 8–23)
CO2: 20 mmol/L — ABNORMAL LOW (ref 22–32)
Calcium: 9.4 mg/dL (ref 8.9–10.3)
Chloride: 105 mmol/L (ref 98–111)
Creatinine, Ser: 0.6 mg/dL (ref 0.44–1.00)
GFR, Estimated: >60 mL/min (ref >60)
Glucose, Bld: 101 mg/dL — ABNORMAL HIGH (ref 70–99)
Potassium: 3.6 mmol/L (ref 3.5–5.1)
Sodium: 136 mmol/L (ref 135–145)
Total Bilirubin: 0.7 mg/dL (ref <1.2)
Total Protein: 7.9 g/dL (ref 6.5–8.1)

## 2023-03-01 LAB — CBC WITH DIFFERENTIAL/PLATELET
Abs Immature Granulocytes: 0.04 10*3/uL (ref 0.00–0.07)
Basophils Absolute: 0.1 10*3/uL (ref 0.0–0.1)
Basophils Relative: 1 %
Eosinophils Absolute: 0.2 10*3/uL (ref 0.0–0.5)
Eosinophils Relative: 2 %
HCT: 37 % (ref 36.0–46.0)
Hemoglobin: 12.5 g/dL (ref 12.0–15.0)
Immature Granulocytes: 1 %
Lymphocytes Relative: 17 %
Lymphs Abs: 1.4 10*3/uL (ref 0.7–4.0)
MCH: 30.3 pg (ref 26.0–34.0)
MCHC: 33.8 g/dL (ref 30.0–36.0)
MCV: 89.8 fL (ref 80.0–100.0)
Monocytes Absolute: 0.6 10*3/uL (ref 0.1–1.0)
Monocytes Relative: 8 %
Neutro Abs: 5.5 10*3/uL (ref 1.7–7.7)
Neutrophils Relative %: 71 %
Platelets: 274 10*3/uL (ref 150–400)
RBC: 4.12 MIL/uL (ref 3.87–5.11)
RDW: 13.6 % (ref 11.5–15.5)
WBC: 7.8 10*3/uL (ref 4.0–10.5)
nRBC: 0 % (ref 0.0–0.2)

## 2023-03-01 LAB — URINALYSIS, W/ REFLEX TO CULTURE (INFECTION SUSPECTED)
Bacteria, UA: NONE SEEN
Bilirubin Urine: NEGATIVE
Glucose, UA: NEGATIVE mg/dL
Hgb urine dipstick: NEGATIVE
Ketones, ur: NEGATIVE mg/dL
Nitrite: NEGATIVE
Protein, ur: NEGATIVE mg/dL
Specific Gravity, Urine: 1.011 (ref 1.005–1.030)
pH: 5 (ref 5.0–8.0)

## 2023-03-01 LAB — POCT URINALYSIS DIP (MANUAL ENTRY)
Bilirubin, UA: NEGATIVE
Blood, UA: NEGATIVE
Glucose, UA: NEGATIVE mg/dL
Ketones, POC UA: NEGATIVE mg/dL
Nitrite, UA: NEGATIVE
Spec Grav, UA: >=1.03 — AB (ref 1.010–1.025)
Urobilinogen, UA: 0.2 U/dL
pH, UA: 6 (ref 5.0–8.0)

## 2023-03-01 LAB — TROPONIN I (HIGH SENSITIVITY): Troponin I (High Sensitivity): 7 ng/L (ref <18)

## 2023-03-01 LAB — LIPASE, BLOOD: Lipase: 25 U/L (ref 11–51)

## 2023-03-01 MED ORDER — SODIUM CHLORIDE 0.9 % IV BOLUS
500.0000 mL | Freq: Once | INTRAVENOUS | Status: AC
  Administered 2023-03-01: 500 mL via INTRAVENOUS

## 2023-03-01 NOTE — ED Provider Notes (Signed)
Patient here today with son for suspected UTI for COVID.  UA results not consistent with UTI patient states she is having palpitations.  She also reports fatigue.  I recommended further evaluation emergency room stat labs.  EKG in office are similar to prior.  Son to transport via POV.   Tomi Bamberger, PA-C 03/01/23 1550

## 2023-03-01 NOTE — Discharge Instructions (Signed)
We discussed staying hydrated at home and trying to eat as best she can.  I suspect you are feeling poorly as a result of getting over COVID.  If you develop severe chest pain, trouble breathing, confusion, or return of high fevers he should return to the emergency department for reevaluation.

## 2023-03-01 NOTE — Telephone Encounter (Signed)
Pt is calling and she is having trouble when she urinates and drinking more water. Pt unable to come in and was transfer to triage nurse

## 2023-03-01 NOTE — ED Provider Notes (Signed)
Emergency Department Provider Note   I have reviewed the triage vital signs and the nursing notes.   HISTORY  Chief Complaint Weakness   HPI Robin Howe is a 72 y.o. female with PMH of GERD, HLD, HTN, and COVID diagnosis 11 days prior presents to the emergency department with generalized weakness and poor appetite.  She does have some mild discomfort with urination but no hesitancy or urgency.  No fevers.  She has not had a fever for the last 4 days.  She continues to eat but feels like she needs to force food down due to lack of appetite.  Denies nausea or vomiting.  No diarrhea.  No discomfort in her chest.  She went to urgent care today thinking she may have developed a urinary tract infection and noticed that she was feeling some rapid heart rate.  No specific chest discomfort or shortness of breath.  No changes to medications.  She was unable to take Paxlovid due to diagnosis after day 5 of symptoms. No productive cough or fever.    Past Medical History:  Diagnosis Date   Arthritis    Colon polyps    Depression    GERD (gastroesophageal reflux disease)    Hyperlipidemia    Hypertension    Irritable bowel syndrome (IBS)    Kidney stones    sees Dr. Larey Dresser    Multiple lung nodules    both lung bases, appear benign, next scheduled CT will be march 2013    Review of Systems  Constitutional: No fever/chills. Positive weakness and fatigue.  Cardiovascular: Denies chest pain. Positive palpitations.  Respiratory: Denies shortness of breath. Gastrointestinal: No abdominal pain.  No nausea, no vomiting.  No diarrhea.  No constipation. Genitourinary: Positive for dysuria. Musculoskeletal: Negative for back pain. Skin: Negative for rash. Neurological: Negative for headaches, focal weakness or numbness.  ____________________________________________   PHYSICAL EXAM:  VITAL SIGNS: ED Triage Vitals  Encounter Vitals Group     BP 03/01/23 1618 (!) 149/73      Pulse Rate 03/01/23 1618 93     Resp 03/01/23 1618 15     Temp 03/01/23 1618 98 F (36.7 C)     Temp Source 03/01/23 1618 Oral     SpO2 03/01/23 1618 97 %     Weight 03/01/23 1619 170 lb (77.1 kg)     Height 03/01/23 1619 4\' 11"  (1.499 m)   Constitutional: Alert and oriented. Well appearing and in no acute distress. Eyes: Conjunctivae are normal.  Head: Atraumatic. Nose: No congestion/rhinnorhea. Mouth/Throat: Mucous membranes are moist. Neck: No stridor.   Cardiovascular: Normal rate, regular rhythm. Good peripheral circulation. Grossly normal heart sounds.   Respiratory: Normal respiratory effort.  No retractions. Lungs CTAB. Gastrointestinal: Soft and nontender. No distention.  Musculoskeletal: No lower extremity tenderness nor edema. No gross deformities of extremities. Neurologic:  Normal speech and language. No gross focal neurologic deficits are appreciated.  Skin:  Skin is warm, dry and intact. No rash noted.  ____________________________________________   LABS (all labs ordered are listed, but only abnormal results are displayed)  Labs Reviewed  COMPREHENSIVE METABOLIC PANEL - Abnormal; Notable for the following components:      Result Value   CO2 20 (*)    Glucose, Bld 101 (*)    All other components within normal limits  URINALYSIS, W/ REFLEX TO CULTURE (INFECTION SUSPECTED) - Abnormal; Notable for the following components:   Leukocytes,Ua TRACE (*)    All other components within normal  limits  LIPASE, BLOOD  CBC WITH DIFFERENTIAL/PLATELET  TROPONIN I (HIGH SENSITIVITY)   ____________________________________________  EKG   EKG Interpretation Date/Time:  Wednesday March 01 2023 17:14:45 EST Ventricular Rate:  79 PR Interval:  167 QRS Duration:  92 QT Interval:  389 QTC Calculation: 446 R Axis:   -38  Text Interpretation: Sinus rhythm Left axis deviation Confirmed by Alona Bene [phone]) on 03/01/2023 5:19:42 PM         ____________________________________________  RADIOLOGY  DG Chest Portable 1 View  Result Date: 03/01/2023 CLINICAL DATA:  Weakness.  COVID.  Painful urination for 4 days EXAM: PORTABLE CHEST 1 VIEW COMPARISON:  X-ray 12/23/2010 FINDINGS: The heart size and mediastinal contours are within normal limits. No consolidation, pneumothorax or effusion. No edema. Normal cardiopericardial silhouette. Overlapping cardiac leads. The visualized skeletal structures are unremarkable. IMPRESSION: No acute cardiopulmonary disease. Electronically Signed   By: Karen Kays M.D.   On: 03/01/2023 17:08    ____________________________________________   PROCEDURES  Procedure(s) performed:   Procedures  None  ____________________________________________   INITIAL IMPRESSION / ASSESSMENT AND PLAN / ED COURSE  Pertinent labs & imaging results that were available during my care of the patient were reviewed by me and considered in my medical decision making (see chart for details).   This patient is Presenting for Evaluation of weakness, which does require a range of treatment options, and is a complaint that involves a high risk of morbidity and mortality.  The Differential Diagnoses includes UTI, AKI, electrolyte disturbance, post-viral PNA, etc.  Critical Interventions-    Medications  sodium chloride 0.9 % bolus 500 mL (0 mLs Intravenous Stopped 03/01/23 1856)    Reassessment after intervention:  symptoms improved.    I did obtain Additional Historical Information from son at bedside.    Clinical Laboratory Tests Ordered, included UA without infection. No leukocytosis. No AKI. Lipase negative. Troponin negative.   Radiologic Tests Ordered, included CXR. I independently interpreted the images and agree with radiology interpretation.   Cardiac Monitor Tracing which shows NSR.   Social Determinants of Health Risk NSR.   Medical Decision Making: Summary:  Patient presents with  weakness/fatigue in the setting of recent COVID infection. Vitals are reassuring. No hypoxemia. No AMS on my assessment. Plan for IVF, screening labs and CXR along with UA. Patient with some non-specific ST changes on EKG. Will send a single troponin. No recent EKGs for comparison.   Reevaluation with update and discussion with patient. She is feeling improved. Plan for discharge.   Patient's presentation is most consistent with acute presentation with potential threat to life or bodily function.   Disposition: discharge  ____________________________________________  FINAL CLINICAL IMPRESSION(S) / ED DIAGNOSES  Final diagnoses:  Generalized weakness    Note:  This document was prepared using Dragon voice recognition software and may include unintentional dictation errors.  Alona Bene, MD, [facility] Emergency Medicine    Culver Feighner, Arlyss Repress, MD 03/01/23 [phone]

## 2023-03-01 NOTE — ED Notes (Signed)
Pt was in lobby waiting to be seen and pt's son told patient access that she was feeling faint and her heart was racing. Brought pt back to be assessed. Vitals taken and EKG completed. Provider notified.

## 2023-03-01 NOTE — ED Triage Notes (Signed)
Pt reports oliguria and dysuria x4 days. Pt also reports being on day 12 after positive Covid test. Reports fatigue.

## 2023-03-01 NOTE — Telephone Encounter (Signed)
Pt referred by Triage nurse to see PCP within 4 hours. Advised there were no openings at [facility] or other clinics in our vicinity. Triage nurse states they will advise patient to go to ED/UC.

## 2023-03-01 NOTE — Telephone Encounter (Signed)
She was advised by the triage nurse to go to urgent care, and I agree

## 2023-03-01 NOTE — ED Notes (Signed)
Patient is being discharged from the Urgent Care and sent to the Emergency Department via [facility] . Per provider, patient is in need of higher level of care due to elevated heart rate needing further evaluation. Patient is aware and verbalizes understanding of plan of care.  Vitals:   03/01/23 1459 03/01/23 1538  BP: 125/70 (!) 153/79  Pulse: (!) 106 (!) 103  Resp: 18   Temp: 98.4 F (36.9 C) 98 F (36.7 C)  SpO2: 97% 97%

## 2023-03-01 NOTE — ED Triage Notes (Signed)
Patient sent from urgent care. Complaining of painful urination for 4 days. Urgent care tested it and said there was not a high white blood cell count. Was advised to come here due to heart racing and further evaluation. Tested positive for covid on 11/16. Feels weak overall.

## 2023-03-06 ENCOUNTER — Ambulatory Visit: Payer: Medicare PPO | Admitting: Family Medicine

## 2023-03-06 ENCOUNTER — Encounter: Payer: Self-pay | Admitting: Family Medicine

## 2023-03-06 VITALS — BP 110/68 | HR 80 | Temp 98.6°F | Wt 172.0 lb

## 2023-03-06 DIAGNOSIS — R531 Weakness: Secondary | ICD-10-CM | POA: Diagnosis not present

## 2023-03-06 DIAGNOSIS — E860 Dehydration: Secondary | ICD-10-CM

## 2023-03-06 DIAGNOSIS — R300 Dysuria: Secondary | ICD-10-CM

## 2023-03-06 LAB — POC URINALSYSI DIPSTICK (AUTOMATED)
Bilirubin, UA: NEGATIVE
Blood, UA: NEGATIVE
Glucose, UA: NEGATIVE
Ketones, UA: NEGATIVE
Leukocytes, UA: NEGATIVE
Nitrite, UA: NEGATIVE
Protein, UA: POSITIVE — AB
Spec Grav, UA: 1.02 (ref 1.010–1.025)
Urobilinogen, UA: 0.2 U/dL
pH, UA: 6 (ref 5.0–8.0)

## 2023-03-06 MED ORDER — CIPROFLOXACIN HCL 500 MG PO TABS: 500.0000 mg | ORAL_TABLET | Freq: Two times a day (BID) | ORAL | 0 refills | Status: DC

## 2023-03-06 NOTE — Progress Notes (Signed)
   Subjective:    Patient ID: Robin Howe, female    DOB: 18-May-1950, 72 y.o.   MRN: [id number]  HPI Here to follow up on an ED visit on 03-01-23 for several days of generalized weakness, poor appetite, and rapid heart rates. She also had some mild burning on urination. No fever or URI symptoms. Labs were normal except for trace leukocyte esterace in the urine. EKG and CXR were normal. They felt she was simply dehydrated from a Covod infection 11 days before so they gave her one bag of IV fluid. No urine culture was ordered. Today she still feels weak, but the urinary burning has stopped. She is drinking lots of water and Pedialyte.    Review of Systems  Constitutional:  Positive for fatigue. Negative for fever.  HENT: Negative.    Respiratory: Negative.    Cardiovascular: Negative.   Gastrointestinal: Negative.   Genitourinary:  Positive for dysuria. Negative for flank pain, frequency, hematuria and urgency.  Neurological: Negative.        Objective:   Physical Exam Constitutional:      Appearance: Normal appearance.  Cardiovascular:     Rate and Rhythm: Normal rate and regular rhythm.     Pulses: Normal pulses.     Heart sounds: Normal heart sounds.  Pulmonary:     Effort: Pulmonary effort is normal.     Breath sounds: Normal breath sounds.  Abdominal:     General: There is no distension.     Palpations: There is no mass.     Tenderness: There is no abdominal tenderness. There is no right CVA tenderness, left CVA tenderness, guarding or rebound.     Hernia: No hernia is present.  Neurological:     Mental Status: She is alert and oriented to person, place, and time. Mental status is at baseline.           Assessment & Plan:  Prolonged weaknessm which could be from the Covid infection, but I think she most likely has a UTI. We will treat this with 7 days of Cipro. Culture the sample. We spent a total of (31   ) minutes reviewing records and discussing these issues.   Gershon Crane, MD

## 2023-03-06 NOTE — Addendum Note (Signed)
Addended by: Carola Rhine on: 03/06/2023 03:20 PM   Modules accepted: Orders

## 2023-03-07 ENCOUNTER — Ambulatory Visit (INDEPENDENT_AMBULATORY_CARE_PROVIDER_SITE_OTHER): Payer: Self-pay | Admitting: Family Medicine

## 2023-03-07 LAB — URINE CULTURE
MICRO NUMBER:: 15796596
Result:: NO GROWTH
SPECIMEN QUALITY:: ADEQUATE

## 2023-03-21 DIAGNOSIS — M1711 Unilateral primary osteoarthritis, right knee: Secondary | ICD-10-CM | POA: Diagnosis not present

## 2023-03-30 DIAGNOSIS — M1711 Unilateral primary osteoarthritis, right knee: Secondary | ICD-10-CM | POA: Diagnosis not present

## 2023-03-31 DIAGNOSIS — M1711 Unilateral primary osteoarthritis, right knee: Secondary | ICD-10-CM | POA: Diagnosis not present

## 2023-04-06 DIAGNOSIS — M1711 Unilateral primary osteoarthritis, right knee: Secondary | ICD-10-CM | POA: Diagnosis not present

## 2023-04-07 DIAGNOSIS — M1711 Unilateral primary osteoarthritis, right knee: Secondary | ICD-10-CM | POA: Diagnosis not present

## 2023-04-11 DIAGNOSIS — M1711 Unilateral primary osteoarthritis, right knee: Secondary | ICD-10-CM | POA: Diagnosis not present

## 2023-04-13 DIAGNOSIS — M1711 Unilateral primary osteoarthritis, right knee: Secondary | ICD-10-CM | POA: Diagnosis not present

## 2023-04-14 DIAGNOSIS — M1711 Unilateral primary osteoarthritis, right knee: Secondary | ICD-10-CM | POA: Diagnosis not present

## 2023-04-17 ENCOUNTER — Encounter (INDEPENDENT_AMBULATORY_CARE_PROVIDER_SITE_OTHER): Payer: Self-pay | Admitting: Family Medicine

## 2023-04-17 ENCOUNTER — Ambulatory Visit (INDEPENDENT_AMBULATORY_CARE_PROVIDER_SITE_OTHER): Payer: Medicare PPO | Admitting: Family Medicine

## 2023-04-17 VITALS — BP 134/76 | HR 87 | Temp 97.9°F | Ht <= 58 in | Wt 167.0 lb

## 2023-04-17 DIAGNOSIS — Z6836 Body mass index (BMI) 36.0-36.9, adult: Secondary | ICD-10-CM | POA: Diagnosis not present

## 2023-04-17 DIAGNOSIS — E538 Deficiency of other specified B group vitamins: Secondary | ICD-10-CM

## 2023-04-17 DIAGNOSIS — R5383 Other fatigue: Secondary | ICD-10-CM | POA: Diagnosis not present

## 2023-04-17 DIAGNOSIS — E785 Hyperlipidemia, unspecified: Secondary | ICD-10-CM

## 2023-04-17 DIAGNOSIS — I10 Essential (primary) hypertension: Secondary | ICD-10-CM | POA: Diagnosis not present

## 2023-04-17 DIAGNOSIS — M150 Primary generalized (osteo)arthritis: Secondary | ICD-10-CM

## 2023-04-17 DIAGNOSIS — F5089 Other specified eating disorder: Secondary | ICD-10-CM | POA: Diagnosis not present

## 2023-04-17 DIAGNOSIS — Z1331 Encounter for screening for depression: Secondary | ICD-10-CM

## 2023-04-17 DIAGNOSIS — R0602 Shortness of breath: Secondary | ICD-10-CM

## 2023-04-17 DIAGNOSIS — E669 Obesity, unspecified: Secondary | ICD-10-CM

## 2023-04-17 NOTE — Progress Notes (Signed)
 Robin Howe, D.O.  ABFM, ABOM Specializing in Clinical Bariatric Medicine Office located at: [address]  [place], [place]  [address]     Bariatric Medicine Visit  Dear Robin Howe LABOR, MD   Thank you for referring TIONDRA FANG to our clinic today for evaluation.  We performed a consultation to discuss her options for treatment and educate the patient on her disease state.  The following note includes my evaluation and treatment recommendations.   Please do not hesitate to reach out to me directly if you have any further concerns.    Assessment and Plan:   FOR THE DISEASE OF OBESITY: BMI 36.0-36.9,adult Obesity diagnosis needed  Assessment & Plan: Pt reports having her information session at the [place] office.   Recommended Dietary Goals Robin Howe is currently in the action stage of change. As such, her goal is to continue weight management plan. She has agreed to: follow the Category 1 plan - 1000 kcal per day with breakfast and lunch options   Behavioral Intervention We discussed the following Behavioral Modification Strategies today: increasing lean protein intake to established goals, decreasing simple carbohydrates , increasing vegetables, work on meal planning and preparation, and decreasing eating out or consumption of processed foods, and making healthy choices when eating convenient foods  Additional resources provided today: handout on CAT 1 meal plan , Handout on CAT 1-2 breakfast options, and Handout on CAT 1-2 lunch options  Evidence-based interventions for health behavior change were utilized today including the discussion of self monitoring techniques, problem-solving barriers and SMART goal setting techniques.   Regarding patient's less desirable eating habits and patterns, we employed the technique of small changes.   Pt will specifically work on: n/a  Recommended Physical Activity Goals Robin Howe has been advised to work up to 150 minutes  of moderate intensity aerobic activity a week and strengthening exercises 2-3 times per week for cardiovascular health, weight loss maintenance and preservation of muscle mass.   She has agreed to :  Continue current level of physical activity    Pharmacotherapy We discussed various medication options to help Milton with her weight loss efforts and we both agreed to : continue with nutritional and behavioral strategies   FOR ASSOCIATED CONDITIONS ADDRESSED TODAY:  Fatigue Assessment & Plan: Robin Howe does feel that her weight is causing her energy to be lower than it should be. Fatigue may be related to obesity, depression or many other causes. she does not appear to have any red flag symptoms and this appears to most likely be related to her current lifestyle habits and dietary intake.  Labs will be ordered and reviewed with her at their next office visit in two weeks.  Epworth sleepiness scale score appears to be within normal limits.  Her ESS score is 8 .   Robin Howe admits to daytime somnolence and admits to waking up still tired. Patient has a history of symptoms of daytime fatigue, morning fatigue, morning headaches, and hypertension. [name] generally gets about 5  hours of sleep per night (or less), and states that she has difficulty falling asleep. Snoring is present. Apneic episodes are not present.   ECG: Performed and reviewed/interpreted independently.  ECG was done on 03/01/23 - Normal sinus rhythm, rate 79 bpm; reassuring without any acute abnormalities, will continue to monitor for symptoms   Orders: -     VITAMIN D  25 Hydroxy (Vit-D Deficiency, Fractures) -     TSH -     T4, free -  Insulin , random -     Hemoglobin A1c -     Comprehensive metabolic panel -     Lipid Panel With LDL/HDL Ratio -     Folate -     CBC with Differential/Platelet -     Vitamin B12   Shortness of breath on exertion Assessment & Plan: Ronika does feel that she gets out of breath more  easily than she used to when she exercises and seems to be worsening over time with weight gain.  This has gotten worse recently. Robin Howe denies shortness of breath at rest or orthopnea. Robin Howe's shortness of breath appears to be obesity related and exercise induced, as they do not appear to have any red flag symptoms/ concerns today.  Also, this condition appears to be related to a state of poor cardiovascular conditioning   Obtain labs today and will be reviewed with her at their next office visit in two weeks.  Indirect Calorimeter completed today to help guide our dietary regimen. It shows a VO2 of 225 and a REE of 1555.  Her calculated basal metabolic rate is 8737 thus her measured basal metabolic rate is better than expected.  Patient agreed to work on weight loss at this time.  As Robin Howe progresses through our weight loss program, we will gradually increase exercise as tolerated to treat her current condition.   If Robin Howe follows our recommendations and loses 5-10% of their weight without improvement of her shortness of breath or if at any time, symptoms become more concerning, they agree to urgently follow up with their PCP/ specialist for further consideration/ evaluation.   Robin Howe verbalizes agreement with this plan.    Depression screen Assessment & Plan: Her Food and Mood (modified PHQ-9) score was 7. Denies any SI/HI. Will continue to monitor and refer to behavioral health if ever needed in the future.    Other disorder of eating - emotional eating Assessment & Plan: Pt endorses anxiety related to coming in for her first and was only able to sleep about one hour last night. She manages her anxiety and difficulty sleeping with Xanax  0.5 mg as needed. Moods are stable. Denies any SI/HI.   In her new patient packet Robin Howe reported: -Her mood affects her eating more/less than usual at least half the time -Excess weight makes occasionally makes her feel tired or have decreased  energy  -She tends to eat when she is stressed, bored, to comfort herself, and as a reward -Remorse/guilt after eating with anger about her being weak -She states she has an addiction to food/comfort foods -Feels out of control with her eating  -Tends to eat above avg portion sized meals about 4-5 times a week   Breuna was informed of our Bariatric Psychologist, Dr. Sharron. She declined referral today and is open to considering in the future. Discussed how thoughts affect eating habits, modeling of thoughts, feelings, and behaviors, and strategies for change.  Reminded patient of the importance of following their prudent nutrition plan and how food can affect mood as well to support emotional wellbeing.   Primary hypertension Assessment & Plan: BP Readings from Last 3 Encounters:  04/17/23 (!) 152/79  03/06/23 110/68  03/01/23 (!) 149/73   BP is elevated at 152/79 today, which she attributes to not taking her antihypertensives this morning and anxiety for her first visit today. HTN is treated with Norvasc  5 mg once daily. Tolerating well, no side effects.  Her BP typically runs 118-138 systolic and 66-76 diastolic. Has  a cuff at home, but is unsure if she has used it correctly in the past. She does, however, anxiously checking her blood pressure and pulse on her Apple Watch throughout the day. States she only slept one hour last night due to her anxiety.   Discussed lifestyle changes such as following our low salt, heart healthy meal plan and engaging in regular exercise. Encouraged pt to bring in her BP cuff to a follow up with her PCP to review proper use of her cuff and ensure accurate readings. Advised her to monitor her BP at home. Reminded patient that if they ever feel poorly in any way, to check their blood pressure and pulse as well. We will continue to monitor closely alongside PCP/ specialists.     Dyslipidemia Assessment & Plan: Lab Results  Component Value Date   CHOL 155  01/11/2023   HDL 59.70 01/11/2023   LDLCALC 72 01/11/2023   LDLDIRECT 155.7 02/20/2006   TRIG 114.0 01/11/2023   CHOLHDL 3 01/11/2023   Treating HLD with Lipitor 20 mg once daily. Tolerating well, no adverse SE. She reports no exercise since having Covid in November. Since living alone she is not cooking daily as she used to. Reviewed last lipid panel, no concerns today. She reports using sugar alternatives - splenda/sucralose & nutraSweet/Aspartame (Blue packet)   Work on increasing exercise and following our prudent nutritional plan that is low in saturated and trans fats, and low in fatty carbs. Reviewed alternative food options that help to decrease simple carbs and sugars and increase protein intake. Continue with current medication regimen as directed by her PCP.    Primary osteoarthritis involving multiple joints Assessment & Plan:. Pt reports hx of osteoarthritis, pain in her neck/shoulders and worse in her knees. Currently in PT for knee pain. Manages her pain with Meloxicam  15 mg once daily and gabapentin  300 mg as needed. Was previously also on Cipro . Last bone density 09/05/2017 - at that time her T score was normal and recommended repeat was 2 years. Denies hx of osteoporosis. Pt previously self-initiated vitamin D  supplementation but has since stopped.  Encouraged pt to increase exercise as able in an effort to strengthen bone density. Advised pt to follow up with her PCP for concerns related to her pain management medications. Continue to follow up with PT as directed by them. We will continue to monitor her condition as it pertains to her weight loss journey.    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 Has had cervical radiculopathy and transforaminal nerve injections and is also being treated with B12 500 mcg daily OTC. Sees [facility] for this. Reports she self-initiated B12 supplementation and  denies any prior hx of B12 deficiency. Has not previously had her B12 checked. Continue with current regimen as directed by PCP/specialists. Follow up with ortho as instructed by them. We will continue to monitor her condition closely alongside her PCP/specialists as it relates to her weight loss.    FOLLOW UP:   Follow up in 2 weeks. She was informed of the importance of frequent follow up visits to maximize her success with intensive lifestyle modifications for her multiple health conditions.  Robin KATHEE Rams is aware that we will review all of her lab results at our next visit.  She is aware that if anything is critical/ life threatening with the results, we will be contacting her via MyChart prior to the office visit to  discuss management.      Chief Complaint:   OBESITY KEELY DRENNAN (MR# [id number]) is a 73 y.o. female who presents for evaluation and treatment of obesity and related comorbidities. Current BMI is Body mass index is 36.14 kg/m. Robin KATHEE Rams has been struggling with her weight for many years and has been unsuccessful in either losing weight, maintaining weight loss, or reaching her healthy weight goal.  TRINTY MARKEN is currently in the action stage of change and ready to dedicate time achieving and maintaining a healthier weight. Robin KATHEE Rams is interested in becoming our patient and working on intensive lifestyle modifications including (but not limited to) diet and exercise for weight loss.  MITA VALLO is retired. Patient is widowed and lives alone.   Previously Exercised in the pool 3 days a week 45 minutes, prior to getting Covid in November. Averages about 1500-2500 steps a day.   Desires to be down 20 lbs in 4 months.  Hopes to improve her self image and improved joint pain with weight loss.   Reasons gained weight include eating too many comfort foods and carbs.  Tried weight watchers (10 lbs), Tops (6 lbs), and a bariatric clinic in [facility] and [address] many years ago. Pt reports  not being sure which program worked best.   She is eating out once a week.  Overall enjoys cooking - has not been cooking much since living alone.   Cravings include cereal, sweets, nuts and occur mostly after dinner.  Typically snacks on a bowl of cereal.  Drinks coffee with creamer and sweeteners, milk, juice, and tea with sugar.  Worst food habit is her carb intake.   Subjective:   This is the patient's first visit at [facility].  The patient's NEW PATIENT PACKET that they filled out prior to today's office visit was reviewed at length and information from that paperwork was included within the following office visit note.    Included in the packet: current and past health history, medications, allergies, ROS, gynecologic history (women only), surgical history, family history, social history, weight history, weight loss surgery history (for those that have had weight loss surgery), nutritional evaluation, mood and food questionnaire along with a depression screening (PHQ9) on all patients, an Epworth questionnaire, sleep habits questionnaire, patient life and health improvement goals questionnaire. These will all be scanned into the patient's chart under the media tab.   Review of Systems: Please refer to new patient packet scanned into media. Pertinent positives were addressed with patient today.  Reviewed by clinician on day of visit: allergies, medications, problem list, medical history, surgical history, family history, social history, and previous encounter notes.  During the visit, I independently reviewed the patient's EKG, bioimpedance scale results, and indirect calorimeter results. I used this information to tailor a meal plan for the patient that will help BIRIDIANA TWARDOWSKI to lose weight and will improve her obesity-related conditions going forward.  I performed a medically necessary appropriate  examination and/or evaluation. I discussed the assessment and treatment plan with the patient. The patient was provided an opportunity to ask questions and all were answered. The patient agreed with the plan and demonstrated an understanding of the instructions. Labs were ordered today (unless patient declined them) and will be reviewed with the patient at our next visit unless more critical results need to be addressed immediately. Clinical information was updated and documented in the EMR.  Objective:   PHYSICAL EXAM: Blood pressure (!) 152/79, pulse 87, temperature 97.9 F (36.6 C), height 4' 9 (1.448 m), weight 167 lb (75.8 kg), SpO2 97%. Body mass index is 36.14 kg/m.  General: Well Developed, well nourished, and in no acute distress.  HEENT: Normocephalic, atraumatic; EOMI, sclerae are anicteric. Skin: Warm and dry, good turgor Chest:  Normal excursion, shape, no gross ABN Respiratory: No conversational dyspnea; speaking in full sentences NeuroM-Sk:  Normal gross ROM * 4 extremities  Psych: A and O *3, insight adequate, mood- full   Anthropometric Measurements Height: 4' 9 (1.448 m) Weight: 167 lb (75.8 kg) BMI (Calculated): 36.13 Starting Weight: 167 lb Peak Weight: 193 lb Waist Measurement : 47 inches   Body Composition  Body Fat %: 47 % Fat Mass (lbs): 78.8 lbs Muscle Mass (lbs): 84.2 lbs Total Body Water (lbs): 62.6 lbs Visceral Fat Rating : 15   Other Clinical Data RMR: 1555 Fasting: Yes Labs: Yes Today's Visit #: 1 Starting Date: 04/16/22    DIAGNOSTIC DATA REVIEWED:  BMET    Component Value Date/Time   NA 136 03/01/2023 1710   K 3.6 03/01/2023 1710   CL 105 03/01/2023 1710   CO2 20 (L) 03/01/2023 1710   GLUCOSE 101 (H) 03/01/2023 1710   GLUCOSE 99 02/20/2006 0836   BUN 16 03/01/2023 1710   CREATININE 0.60 03/01/2023 1710   CREATININE 0.71 11/18/2019 1334   CALCIUM  9.4 03/01/2023 1710   GFRNONAA >60 03/01/2023 1710   GFRAA >60 12/31/2010  0032   Lab Results  Component Value Date   HGBA1C 5.3 01/11/2023   HGBA1C 5.3 12/02/2020   No results found for: INSULIN  Lab Results  Component Value Date   TSH 1.97 01/11/2023   CBC    Component Value Date/Time   WBC 7.8 03/01/2023 1710   RBC 4.12 03/01/2023 1710   HGB 12.5 03/01/2023 1710   HCT 37.0 03/01/2023 1710   PLT 274 03/01/2023 1710   MCV 89.8 03/01/2023 1710   MCH 30.3 03/01/2023 1710   MCHC 33.8 03/01/2023 1710   RDW 13.6 03/01/2023 1710   Iron Studies No results found for: IRON, TIBC, FERRITIN, IRONPCTSAT Lipid Panel     Component Value Date/Time   CHOL 155 01/11/2023 1031   TRIG 114.0 01/11/2023 1031   TRIG 55 02/20/2006 0836   HDL 59.70 01/11/2023 1031   CHOLHDL 3 01/11/2023 1031   VLDL 22.8 01/11/2023 1031   LDLCALC 72 01/11/2023 1031   LDLCALC 86 11/18/2019 1334   LDLDIRECT 155.7 02/20/2006 0836   Hepatic Function Panel     Component Value Date/Time   PROT 7.9 03/01/2023 1710   ALBUMIN 4.3 03/01/2023 1710   AST 28 03/01/2023 1710   ALT 17 03/01/2023 1710   ALKPHOS 90 03/01/2023 1710   BILITOT 0.7 03/01/2023 1710   BILIDIR 0.1 01/11/2023 1031   IBILI 0.4 11/18/2019 1334      Component Value Date/Time   TSH 1.97 01/11/2023 1031   Nutritional No results found for: VD25OH  Attestation Statements:   I, Vernell Forest, acting as a stage manager for Robin Jenkins, DO., have compiled all relevant documentation for today's office visit on behalf of Jelina Paulsen, DO, while in the presence of [employer], DO.  Reviewed by clinician on day of visit: allergies, medications, problem list, medical history, surgical history, family history, social history, and previous encounter notes pertinent to patient's obesity diagnosis.  I have spent 65 minutes in the care of the patient today including:  preparing to see patient (e.g. review and interpretation of tests, old notes ), obtaining and/or reviewing separately obtained history,  performing a medically appropriate examination or evaluation, counseling and educating the patient, ordering medications, test or procedures, documenting clinical information in the electronic or other health care record, and independently interpreting results and communicating results to the patient, family, or caregiver   I have reviewed the above documentation for accuracy and completeness, and I agree with the above. Robin JINNY Howe, D.O.  The 21st Century Cures Act was signed into law in 2016 which includes the topic of electronic health records.  This provides immediate access to information in MyChart.  This includes consultation notes, operative notes, office notes, lab results and pathology reports.  If you have any questions about what you read please let us  know at your next visit so we can discuss your concerns and take corrective action if need be.  We are right here with you.

## 2023-04-19 ENCOUNTER — Encounter: Payer: Self-pay | Admitting: Family Medicine

## 2023-04-20 DIAGNOSIS — M1711 Unilateral primary osteoarthritis, right knee: Secondary | ICD-10-CM | POA: Diagnosis not present

## 2023-04-21 DIAGNOSIS — M1711 Unilateral primary osteoarthritis, right knee: Secondary | ICD-10-CM | POA: Diagnosis not present

## 2023-04-21 LAB — COMPREHENSIVE METABOLIC PANEL
ALT: 18 [IU]/L (ref 0–32)
AST: 24 [IU]/L (ref 0–40)
Albumin: 4.9 g/dL — ABNORMAL HIGH (ref 3.8–4.8)
Alkaline Phosphatase: 123 [IU]/L — ABNORMAL HIGH (ref 44–121)
BUN/Creatinine Ratio: 24 (ref 12–28)
BUN: 15 mg/dL (ref 8–27)
Bilirubin Total: 0.4 mg/dL (ref 0.0–1.2)
CO2: 23 mmol/L (ref 20–29)
Calcium: 9.9 mg/dL (ref 8.7–10.3)
Chloride: 96 mmol/L (ref 96–106)
Creatinine, Ser: 0.62 mg/dL (ref 0.57–1.00)
Globulin, Total: 2.8 g/dL (ref 1.5–4.5)
Glucose: 98 mg/dL (ref 70–99)
Potassium: 4.3 mmol/L (ref 3.5–5.2)
Sodium: 138 mmol/L (ref 134–144)
Total Protein: 7.7 g/dL (ref 6.0–8.5)
eGFR: 95 mL/min/{1.73_m2} (ref >59)

## 2023-04-21 LAB — CBC WITH DIFFERENTIAL/PLATELET
Basophils Absolute: 0.1 10*3/uL (ref 0.0–0.2)
Basos: 1 %
EOS (ABSOLUTE): 0.3 10*3/uL (ref 0.0–0.4)
Eos: 2 %
Hematocrit: 41.1 % (ref 34.0–46.6)
Hemoglobin: 13.1 g/dL (ref 11.1–15.9)
Immature Grans (Abs): 0 10*3/uL (ref 0.0–0.1)
Immature Granulocytes: 0 %
Lymphocytes Absolute: 2.1 10*3/uL (ref 0.7–3.1)
Lymphs: 20 %
MCH: 29.4 pg (ref 26.6–33.0)
MCHC: 31.9 g/dL (ref 31.5–35.7)
MCV: 92 fL (ref 79–97)
Monocytes Absolute: 0.7 10*3/uL (ref 0.1–0.9)
Monocytes: 7 %
Neutrophils Absolute: 7.3 10*3/uL — ABNORMAL HIGH (ref 1.4–7.0)
Neutrophils: 70 %
Platelets: 354 10*3/uL (ref 150–450)
RBC: 4.45 x10E6/uL (ref 3.77–5.28)
RDW: 14.4 % (ref 11.7–15.4)
WBC: 10.5 10*3/uL (ref 3.4–10.8)

## 2023-04-21 LAB — TSH: TSH: 5.07 u[IU]/mL — ABNORMAL HIGH (ref 0.450–4.500)

## 2023-04-21 LAB — LIPID PANEL WITH LDL/HDL RATIO
Cholesterol, Total: 193 mg/dL (ref 100–199)
HDL: 73 mg/dL (ref >39)
LDL Chol Calc (NIH): 97 mg/dL (ref 0–99)
LDL/HDL Ratio: 1.3 {ratio} (ref 0.0–3.2)
Triglycerides: 133 mg/dL (ref 0–149)
VLDL Cholesterol Cal: 23 mg/dL (ref 5–40)

## 2023-04-21 LAB — VITAMIN D 25 HYDROXY (VIT D DEFICIENCY, FRACTURES): Vit D, 25-Hydroxy: 25 ng/mL — ABNORMAL LOW (ref 30.0–100.0)

## 2023-04-21 LAB — INSULIN, RANDOM: INSULIN: 17.9 u[IU]/mL (ref 2.6–24.9)

## 2023-04-21 LAB — FOLATE: Folate: 17.7 ng/mL (ref >3.0)

## 2023-04-21 LAB — T4, FREE: Free T4: 1.19 ng/dL (ref 0.82–1.77)

## 2023-04-21 LAB — HEMOGLOBIN A1C
Est. average glucose Bld gHb Est-mCnc: 103 mg/dL
Hgb A1c MFr Bld: 5.2 % (ref 4.8–5.6)

## 2023-04-21 LAB — VITAMIN B12: Vitamin B-12: 796 pg/mL (ref 232–1245)

## 2023-04-21 NOTE — Telephone Encounter (Signed)
FYI

## 2023-04-21 NOTE — Telephone Encounter (Signed)
I would be happy to go over these results with her, but she should make an OV so we can sit down together and talk

## 2023-04-21 NOTE — Telephone Encounter (Signed)
Drink 6 bottles a day of water or PowerAde Zero

## 2023-04-25 ENCOUNTER — Encounter: Payer: Self-pay | Admitting: Family Medicine

## 2023-04-25 ENCOUNTER — Telehealth: Payer: Medicare PPO | Admitting: Family Medicine

## 2023-04-25 DIAGNOSIS — G4700 Insomnia, unspecified: Secondary | ICD-10-CM

## 2023-04-25 DIAGNOSIS — R7989 Other specified abnormal findings of blood chemistry: Secondary | ICD-10-CM

## 2023-04-25 MED ORDER — TRAZODONE HCL 50 MG PO TABS: 50.0000 mg | ORAL_TABLET | Freq: Every day | ORAL | 2 refills | Status: DC

## 2023-04-25 NOTE — Progress Notes (Signed)
Subjective:    Patient ID: Robin Howe, female    DOB: 07-31-1950, 73 y.o.   MRN: [id number]  HPI Virtual Visit via Video Note  I connected with the patient on 04/25/23 at  8:15 AM EST by a video enabled telemedicine application and verified that I am speaking with the correct person using two identifiers.  Location patient: home Location provider:work or home office Persons participating in the virtual visit: patient, provider  I discussed the limitations of evaluation and management by telemedicine and the availability of in person appointments. The patient expressed understanding and agreed to proceed.   HPI:    ROS: See pertinent positives and negatives per HPI.  Past Medical History:  Diagnosis Date   Arthritis    Back pain    Colon polyps    Depression    GERD (gastroesophageal reflux disease)    Hyperlipidemia    Hypertension    Irritable bowel syndrome (IBS)    Joint pain    Kidney stones    sees Dr. Larey Dresser    Knee pain    Multiple lung nodules    both lung bases, appear benign, next scheduled CT will be march 2013   Nerve pain    Osteoarthritis     Past Surgical History:  Procedure Laterality Date   BREAST REDUCTION SURGERY     bilateral    CARPAL TUNNEL RELEASE     bilateral, per Dr. Mikal Plane   CATARACT EXTRACTION, BILATERAL Bilateral 2018   CESAREAN SECTION     CHOLECYSTECTOMY     COLONOSCOPY  04/20/2021   per Dr. Russella Dar, adenomatous polyps, repeat in 5 yrs   LUMBAR FUSION  2006   per Dr. Franky Macho   LUMBAR LAMINECTOMY      Family History  Problem Relation Age of Onset   Colon cancer Mother        66   Stroke Mother    High blood pressure Father    Alzheimer's disease Sister    GER disease Son    Esophageal cancer Neg Hx    Rectal cancer Neg Hx    Stomach cancer Neg Hx      Current Outpatient Medications:    ALPRAZolam (XANAX) 0.5 MG tablet, Take 1 tablet (0.5 mg total) by mouth at bedtime as needed for sleep., Disp: 30  tablet, Rfl: 5   amLODipine (NORVASC) 5 MG tablet, TAKE 1 TABLET BY MOUTH EVERY DAY, Disp: 90 tablet, Rfl: 0   atorvastatin (LIPITOR) 20 MG tablet, TAKE 1 TABLET BY MOUTH EVERY DAY, Disp: 90 tablet, Rfl: 3   Cyanocobalamin (VITAMIN B-12 PO), Take by mouth daily., Disp: , Rfl:    Ferrous Sulfate (IRON PO), Take by mouth. Takes 10 days prior to giving blood, Disp: , Rfl:    gabapentin (NEURONTIN) 300 MG capsule, TAKE 1 CAPSULE BY MOUTH EVERYDAY AT BEDTIME, Disp: 90 capsule, Rfl: 1   meloxicam (MOBIC) 15 MG tablet, TAKE 1 TABLET (15 MG TOTAL) BY MOUTH DAILY., Disp: 90 tablet, Rfl: 1   traZODone (DESYREL) 50 MG tablet, Take 1 tablet (50 mg total) by mouth at bedtime., Disp: 30 tablet, Rfl: 2  EXAM:  VITALS per patient if applicable:  GENERAL: alert, oriented, appears well and in no acute distress  HEENT: atraumatic, conjunttiva clear, no obvious abnormalities on inspection of external nose and ears  NECK: normal movements of the head and neck  LUNGS: on inspection no signs of respiratory distress, breathing rate appears normal, no obvious gross SOB,  gasping or wheezing  CV: no obvious cyanosis  MS: moves all visible extremities without noticeable abnormality  PSYCH/NEURO: pleasant and cooperative, no obvious depression or anxiety, speech and thought processing grossly intact  ASSESSMENT AND PLAN: As for her abnormal TSH, she will come by our lab  this week for another TSHa nd free T4, and we will add  free T3. For insomnia, she will try Trazodone 50 mg at bedtime.  Gershon Crane, MD  Discussed the following assessment and plan:  Abnormal TSH - Plan: TSH, T3, free, T4, free     I discussed the assessment and treatment plan with the patient. The patient was provided an opportunity to ask questions and all were answered. The patient agreed with the plan and demonstrated an understanding of the instructions.   The patient was advised to call back or seek an in-person evaluation if the  symptoms worsen or if the condition fails to improve as anticipated.       Here for several issues. First she continues to feel tired most of the time and she has trouble sleeping most nights. She takes Xanax and Gabapentin in the evenings, and she falls asleep readily. However she wakes up an hour later and stays up. She also wants to go over [facility]. These were drawn on 04-17-23, and they were remarkable mostly for an elevated TSH at 5.070. Her free T4 was normal at 1.19, but no free T3 was ordered. Otherwise her labs were within normal ranges. She continues to hydrate herself well every day.    Review of Systems     Objective:   Physical Exam        Assessment & Plan:

## 2023-04-26 ENCOUNTER — Other Ambulatory Visit: Payer: Medicare PPO

## 2023-04-28 ENCOUNTER — Other Ambulatory Visit (INDEPENDENT_AMBULATORY_CARE_PROVIDER_SITE_OTHER): Payer: Medicare PPO

## 2023-04-28 DIAGNOSIS — R7989 Other specified abnormal findings of blood chemistry: Secondary | ICD-10-CM | POA: Diagnosis not present

## 2023-04-28 DIAGNOSIS — R946 Abnormal results of thyroid function studies: Secondary | ICD-10-CM | POA: Diagnosis not present

## 2023-04-28 LAB — TSH: TSH: 2.05 u[IU]/mL (ref 0.35–5.50)

## 2023-04-28 LAB — T4, FREE: Free T4: 0.81 ng/dL (ref 0.60–1.60)

## 2023-04-28 LAB — T3, FREE: T3, Free: 3.1 pg/mL (ref 2.3–4.2)

## 2023-05-01 ENCOUNTER — Encounter (INDEPENDENT_AMBULATORY_CARE_PROVIDER_SITE_OTHER): Payer: Self-pay | Admitting: Family Medicine

## 2023-05-01 ENCOUNTER — Ambulatory Visit (INDEPENDENT_AMBULATORY_CARE_PROVIDER_SITE_OTHER): Payer: Medicare PPO | Admitting: Family Medicine

## 2023-05-01 ENCOUNTER — Encounter: Payer: Self-pay | Admitting: *Deleted

## 2023-05-01 VITALS — BP 126/73 | HR 104 | Temp 97.9°F | Ht <= 58 in | Wt 166.0 lb

## 2023-05-01 DIAGNOSIS — E66812 Obesity, class 2: Secondary | ICD-10-CM

## 2023-05-01 DIAGNOSIS — Z6836 Body mass index (BMI) 36.0-36.9, adult: Secondary | ICD-10-CM

## 2023-05-01 DIAGNOSIS — I10 Essential (primary) hypertension: Secondary | ICD-10-CM

## 2023-05-01 DIAGNOSIS — E559 Vitamin D deficiency, unspecified: Secondary | ICD-10-CM

## 2023-05-01 DIAGNOSIS — E785 Hyperlipidemia, unspecified: Secondary | ICD-10-CM | POA: Diagnosis not present

## 2023-05-01 DIAGNOSIS — R7989 Other specified abnormal findings of blood chemistry: Secondary | ICD-10-CM

## 2023-05-01 DIAGNOSIS — E88819 Insulin resistance, unspecified: Secondary | ICD-10-CM

## 2023-05-01 MED ORDER — VITAMIN D (ERGOCALCIFEROL) 1.25 MG (50000 UNIT) PO CAPS: 50000.0000 [IU] | ORAL_CAPSULE | ORAL | 0 refills | Status: DC

## 2023-05-01 NOTE — Progress Notes (Signed)
Carlye Grippe, D.O.  ABFM, ABOM Clinical Bariatric Medicine Physician  Office located at: [address] [place], [place]  [address]     Assessment and Plan:   FOR THE DISEASE OF OBESITY: BMI 36.0-36.9,adult - Starting BMI 36.13 on 04/17/23 Obesity, Class II, BMI 35-39.9 Assessment & Plan: Since last office visit on 04/17/23 patient's muscle mass has decreased by 1.6lb. Fat mass has increased by 0.2 lb. Total body water has increased by 2.8lb.  Counseling done on how various foods will affect these numbers and how to maximize success  Total lbs lost to date: 1 lb Total weight loss percentage to date: -0.60%   Recommended Dietary Goals Margaret is currently in the action stage of change. As such, her goal is to continue weight management plan.  She has agreed to: continue current plan  Behavioral Intervention We discussed the following Behavioral Modification Strategies today: increasing lean protein intake to established goals, decreasing simple carbohydrates , increasing vegetables, increasing lower glycemic fruits, avoiding skipping meals, increasing water intake , keeping healthy foods at home, decreasing eating out or consumption of processed foods, and making healthy choices when eating convenient foods, avoiding temptations and identifying enticing environmental cues, continue to work on implementation of reduced calorie nutritional plan, continue to practice mindfulness when eating, planning for success, and continue to work on maintaining a reduced calorie state, getting the recommended amount of protein, incorporating whole foods, making healthy choices, staying well hydrated and practicing mindfulness when eating.  Additional resources provided today:  Handout on insulin resistance and health risks. Showed pt how to correctly use the food scale.  Evidence-based interventions for health behavior change were utilized today including the discussion of self monitoring  techniques, problem-solving barriers and SMART goal setting techniques.   Regarding patient's less desirable eating habits and patterns, we employed the technique of small changes.   Pt will specifically work on: Meeting her daily protein goal and correctly using her food scale for next visit.   Recommended Physical Activity Goals Ravon has been advised to work up to 150 minutes of moderate intensity aerobic activity a week and strengthening exercises 2-3 times per week for cardiovascular health, weight loss maintenance and preservation of muscle mass.   She has agreed to :  Think about enjoyable ways to increase daily physical activity and overcoming barriers to exercise and Increase physical activity in their day and reduce sedentary time (increase NEAT).   Pharmacotherapy We discussed various medication options to help Jasline with her weight loss efforts and we both agreed to : continue with nutritional and behavioral strategies   FOR ASSOCIATED CONDITIONS ADDRESSED TODAY: Insulin resistance Assessment & Plan: Lab Results  Component Value Date   HGBA1C 5.2 04/17/2023   HGBA1C 5.3 01/11/2023   HGBA1C 5.2 12/28/2021   INSULIN 17.9 04/17/2023    Fasting insulin elevated at 17.9 as of 04/17/23. A1C stable at 5.2 as of 04/17/23. Not on any medications. Diet/exercise approach, though pt is not exercising currently. Pt is drinking about 80 fl oz daily, reports drinking water helps her with hunger/cravings. Also drinks sugar free Powerade to use as a refillable water bottle.   Educated pt on insulin resistance and provided handout for further education, stressed to pt that she has insulin resistance and not prediabetes. Discussed how losing weight will help improve her insulin function. Increase protein intake, decrease simple carbs/sugars, and increase water intake. Advised pt to research more about her condition on the American Diabetes Association website. Encouraged  pt to follow her meal  plan and start exercising as she is able to in an effort to lose weight.    Primary hypertension Assessment & Plan: BP Readings from Last 3 Encounters:  05/01/23 126/73  04/17/23 134/76  03/06/23 110/68   BP at goal today. Pt is treating condition with Amlodipine 5 mg once daily, managed by Dr. Clent Ridges. Tolerating well, no SE reported. No acute concerns today. Continue with her current regimen as directed by PCP. Continue to follow up with PCP as instructed by them. No changes made today.    Dyslipidemia Assessment & Plan: Lab Results  Component Value Date   CHOL 193 04/17/2023   HDL 73 04/17/2023   LDLCALC 97 04/17/2023   LDLDIRECT 155.7 02/20/2006   TRIG 133 04/17/2023   CHOLHDL 3 01/11/2023   Per labs obtained on 04/17/23, her cholesterol is at goal. HDL has improved to 73 from 59.7 3 months ago. Pt is compliant with Lipitor 20 mg once daily.   Advised pt to avoid fatty meats, butter, and fried foods. Reviewed goal of working to increase her HDL and decreasing her LDL. Continue with current medication regimen. No changes were made today.    Hx of Abnormal TSH Assessment & Plan: Lab Results  Component Value Date   TSH 2.05 04/28/2023   T3, Free 3.1 04/28/2023   T4, Free 0.81 04/28/2023   Reviewed new labs from PCP from 3 days ago, now corrected. Repeat TSH is at goal and T3 has improved. Pt is not on any medications. No acute concerns today. Pt will continue with meal plan. Follow up with PCP as directed by them.    Vitamin D deficiency Assessment & Plan: Lab Results  Component Value Date   VD25OH 25.0 (L) 04/17/2023   Last vitamin D was below goal at 25.0 on 04/17/23. She is not currently on any supplementations. Bone density is overdue, last done 09/2017. Reviewed her last bone density; T-scores are normal and RFL was close to abnormal limits.   I recommend she start weekly vitamin D supplementation with ERGO 50K units once weekly. Reviewed risks/benefits with pt, she  verbalized understanding. With weight loss, vitamin D is expected to improve. We will recheck her vitamin D in 3-4 months to access the effects of supplementation. Additionally, educated given on how alkaline phosphatase can be elevated when vitamin D is sub-optimal. Encouraged pt to call  radiology on [address] to schedule her next bone density.   Orders: - Start ERGO today, 50K units once weekly    FOLLOW UP:   Return in about 3 weeks (around 05/22/2023). She was informed of the importance of frequent follow up visits to maximize her success with intensive lifestyle modifications for her multiple health conditions.   Subjective:   Chief complaint: Obesity Bayleigh is here to discuss her progress with her obesity treatment plan. She is on the Category 1 Plan with B/L options and states she is following her eating plan approximately 90 % of the time. She states she is not exercising.  Interval History:  CHANNAH GODEAUX is here today for her first follow-up office visit since starting the program with [place].  Since last office visit she is down 1 lbs. She has been measuring all cooked proteins. Doesn't feel hungry and states it has not been to eat all her food at meal time. Usually has 100 snacks calories after dinner. Has been measuring 1 cup of veggies. Ground [place], chicken, [place] lunch meat, eggs, cheese sticks,  zucchini, beets, cauliflower, etc. Breakfast this morning was a mandarin and peanuts (100 calorie snack). She has started making her own 100 calorie snack bags by looking at serving sizes and adjusting/calculating the measurements to ensure it is accurately 100 calories per bag.   All blood work/ lab tests that were recently ordered by myself or an outside provider were reviewed with patient today per their request. Extended time was spent counseling her on all new disease processes that were discovered or preexisting ones that are affected by BMI.  she understands that many of these  abnormalities will need to monitored regularly along with the current treatment plan of prudent dietary changes, in which we are making each and every office visit, to improve these health parameters.  Pharmacotherapy for weight loss: She is not currently taking medications  for medical weight loss.  Denies side effects.    Review of Systems:  Pertinent positives were addressed with patient today.  Reviewed by clinician on day of visit: allergies, medications, problem list, medical history, surgical history, family history, social history, and previous encounter notes.   Weight Summary and Biometrics   Weight Lost Since Last Visit: 1 lb  Weight Gained Since Last Visit: 0 lb   Vitals Temp: 97.9 F (36.6 C) BP: 126/73 Pulse Rate: (!) 104 SpO2: 99 %   Anthropometric Measurements Height: 4\' 9"  (1.448 m) Weight: 166 lb (75.3 kg) BMI (Calculated): 35.91 Weight at Last Visit: 167 lb Weight Lost Since Last Visit: 1 lb Weight Gained Since Last Visit: 0 lb Starting Weight: 167 lb Total Weight Loss (lbs): 1 lb (0.454 kg) Peak Weight: 193 lb   Body Composition  Body Fat %: 47.5 % Fat Mass (lbs): 79 lbs Muscle Mass (lbs): 82.6 lbs Total Body Water (lbs): 65.4 lbs Visceral Fat Rating : 15   Other Clinical Data RMR: 1555 Fasting: No Labs: Yes Today's Visit #: 2 Starting Date: 04/16/22     Objective:   PHYSICAL EXAM:  Blood pressure 126/73, pulse (!) 104, temperature 97.9 F (36.6 C), height 4\' 9"  (1.448 m), weight 166 lb (75.3 kg), SpO2 99%. Body mass index is 35.92 kg/m.  General: she is overweight, cooperative and in no acute distress.   HEENT: EOMI, sclerae are anicteric. Lungs: Normal breathing effort, no conversational dyspnea. M-Sk:  Normal gross ROM * 4 extremities  PSYCH: Has normal mood, affect and thought process. Neurologic: No gross sensory or motor deficits. Well developed, A and O * 3  DIAGNOSTIC DATA REVIEWED:  BMET    Component Value  Date/Time   NA 138 04/17/2023 1337   K 4.3 04/17/2023 1337   CL 96 04/17/2023 1337   CO2 23 04/17/2023 1337   GLUCOSE 98 04/17/2023 1337   GLUCOSE 101 (H) 03/01/2023 1710   GLUCOSE 99 02/20/2006 0836   BUN 15 04/17/2023 1337   CREATININE 0.62 04/17/2023 1337   CREATININE 0.71 11/18/2019 1334   CALCIUM 9.9 04/17/2023 1337   GFRNONAA >60 03/01/2023 1710   GFRAA >60 12/31/2010 0032   Lab Results  Component Value Date   HGBA1C 5.2 04/17/2023   HGBA1C 5.3 12/02/2020   Lab Results  Component Value Date   INSULIN 17.9 04/17/2023   Lab Results  Component Value Date   TSH 2.05 04/28/2023   CBC    Component Value Date/Time   WBC 10.5 04/17/2023 1337   WBC 7.8 03/01/2023 1710   RBC 4.45 04/17/2023 1337   RBC 4.12 03/01/2023 1710   HGB 13.1 04/17/2023 1337  HCT 41.1 04/17/2023 1337   PLT 354 04/17/2023 1337   MCV 92 04/17/2023 1337   MCH 29.4 04/17/2023 1337   MCH 30.3 03/01/2023 1710   MCHC 31.9 04/17/2023 1337   MCHC 33.8 03/01/2023 1710   RDW 14.4 04/17/2023 1337   Iron Studies No results found for: "IRON", "TIBC", "FERRITIN", "IRONPCTSAT" Lipid Panel     Component Value Date/Time   CHOL 193 04/17/2023 1337   TRIG 133 04/17/2023 1337   TRIG 55 02/20/2006 0836   HDL 73 04/17/2023 1337   CHOLHDL 3 01/11/2023 1031   VLDL 22.8 01/11/2023 1031   LDLCALC 97 04/17/2023 1337   LDLCALC 86 11/18/2019 1334   LDLDIRECT 155.7 02/20/2006 0836   Hepatic Function Panel     Component Value Date/Time   PROT 7.7 04/17/2023 1337   ALBUMIN 4.9 (H) 04/17/2023 1337   AST 24 04/17/2023 1337   ALT 18 04/17/2023 1337   ALKPHOS 123 (H) 04/17/2023 1337   BILITOT 0.4 04/17/2023 1337   BILIDIR 0.1 01/11/2023 1031   IBILI 0.4 11/18/2019 1334      Component Value Date/Time   TSH 2.05 04/28/2023 1053   Nutritional Lab Results  Component Value Date   VD25OH 25.0 (L) 04/17/2023    Attestations:   Reviewed by clinician on day of visit: allergies, medications, problem list,  medical history, surgical history, family history, social history, and previous encounter notes pertinent to patient's obesity diagnosis.   I have spent 44 minutes in the care of the patient today including: preparing to see patient (e.g. review and interpretation of tests, old notes ), obtaining and/or reviewing separately obtained history, performing a medically appropriate examination or evaluation, counseling and educating the patient, ordering medications, test or procedures, documenting clinical information in the electronic or other health care record, and independently interpreting results and communicating results to the patient, family, or caregiver   I, Isabelle Course, acting as a medical scribe for Thomasene Lot, DO., have compiled all relevant documentation for today's office visit on behalf of Amberlee Garvey, DO, while in the presence of [employer], DO.  I have reviewed the above documentation for accuracy and completeness, and I agree with the above. Carlye Grippe, D.O.  The 21st Century Cures Act was signed into law in 2016 which includes the topic of electronic health records.  This provides immediate access to information in MyChart.  This includes consultation notes, operative notes, office notes, lab results and pathology reports.  If you have any questions about what you read please let us know at your next visit so we can discuss your concerns and take corrective action if need be.  We are right here with you.

## 2023-05-02 ENCOUNTER — Other Ambulatory Visit: Payer: Medicare PPO

## 2023-05-03 ENCOUNTER — Encounter: Payer: Self-pay | Admitting: Family Medicine

## 2023-05-03 DIAGNOSIS — M1711 Unilateral primary osteoarthritis, right knee: Secondary | ICD-10-CM | POA: Diagnosis not present

## 2023-05-05 DIAGNOSIS — M1711 Unilateral primary osteoarthritis, right knee: Secondary | ICD-10-CM | POA: Diagnosis not present

## 2023-05-10 ENCOUNTER — Ambulatory Visit: Payer: Medicare PPO | Admitting: Family Medicine

## 2023-05-10 DIAGNOSIS — M1711 Unilateral primary osteoarthritis, right knee: Secondary | ICD-10-CM | POA: Diagnosis not present

## 2023-05-13 ENCOUNTER — Other Ambulatory Visit: Payer: Self-pay | Admitting: Family Medicine

## 2023-05-13 DIAGNOSIS — M810 Age-related osteoporosis without current pathological fracture: Secondary | ICD-10-CM

## 2023-05-17 ENCOUNTER — Other Ambulatory Visit: Payer: Self-pay | Admitting: Family Medicine

## 2023-05-17 DIAGNOSIS — M1711 Unilateral primary osteoarthritis, right knee: Secondary | ICD-10-CM | POA: Diagnosis not present

## 2023-05-19 DIAGNOSIS — M1711 Unilateral primary osteoarthritis, right knee: Secondary | ICD-10-CM | POA: Diagnosis not present

## 2023-05-22 ENCOUNTER — Ambulatory Visit (INDEPENDENT_AMBULATORY_CARE_PROVIDER_SITE_OTHER): Payer: Medicare PPO | Admitting: Adult Health

## 2023-05-22 ENCOUNTER — Encounter (INDEPENDENT_AMBULATORY_CARE_PROVIDER_SITE_OTHER): Payer: Self-pay | Admitting: Adult Health

## 2023-05-22 ENCOUNTER — Other Ambulatory Visit (INDEPENDENT_AMBULATORY_CARE_PROVIDER_SITE_OTHER): Payer: Self-pay | Admitting: Adult Health

## 2023-05-22 VITALS — BP 146/85 | HR 89 | Temp 98.7°F | Ht <= 58 in | Wt 161.0 lb

## 2023-05-22 DIAGNOSIS — I10 Essential (primary) hypertension: Secondary | ICD-10-CM

## 2023-05-22 DIAGNOSIS — E66812 Obesity, class 2: Secondary | ICD-10-CM

## 2023-05-22 DIAGNOSIS — E785 Hyperlipidemia, unspecified: Secondary | ICD-10-CM

## 2023-05-22 DIAGNOSIS — E559 Vitamin D deficiency, unspecified: Secondary | ICD-10-CM | POA: Diagnosis not present

## 2023-05-22 DIAGNOSIS — Z6836 Body mass index (BMI) 36.0-36.9, adult: Secondary | ICD-10-CM

## 2023-05-22 DIAGNOSIS — R7989 Other specified abnormal findings of blood chemistry: Secondary | ICD-10-CM

## 2023-05-22 DIAGNOSIS — E88819 Insulin resistance, unspecified: Secondary | ICD-10-CM

## 2023-05-22 DIAGNOSIS — Z6835 Body mass index (BMI) 35.0-35.9, adult: Secondary | ICD-10-CM

## 2023-05-22 MED ORDER — VITAMIN D (ERGOCALCIFEROL) 1.25 MG (50000 UNIT) PO CAPS
50000.0000 [IU] | ORAL_CAPSULE | ORAL | 0 refills | Status: AC
Start: 2023-05-22 — End: ?

## 2023-05-22 NOTE — Progress Notes (Signed)
 WEIGHT SUMMARY AND BIOMETRICS  Vitals Temp: 98.7 F (37.1 C) BP: (!) 146/85 Pulse Rate: 89 SpO2: 98 %   Anthropometric Measurements Height: 4\' 9"  (1.448 m) Weight: 161 lb (73 kg) BMI (Calculated): 34.83 Weight at Last Visit: 166 lb Weight Lost Since Last Visit: 5 lb Weight Gained Since Last Visit: 0 Starting Weight: 167 lb Total Weight Loss (lbs): 6 lb (2.722 kg) Peak Weight: 193 lb   Body Composition  Body Fat %: 45.7 % Fat Mass (lbs): 74 lbs Muscle Mass (lbs): 83.4 lbs Total Body Water (lbs): 63 lbs Visceral Fat Rating : 14   Other Clinical Data RMR: 1555 Fasting: no Labs: no Today's Visit #: 3 Starting Date: 04/16/22    Chief Complaint:   OBESITY Robin Howe is here to discuss her progress with her obesity treatment plan.  She is on the the Category 1 Plan and states she is following her eating plan approximately 90 % of the time.  She states she is exercising: NEAT Activities   Interim History:  2025 Health Goals: 1) Increase daily activity 2) Feel better, especially in joints  Hydration she estimates to drink at least 80 oz water/day  Reviewed Bioimpedance results with pt: Muscle Mass: + 0.8 lb Adipose Mass: - 5 lbs  Subjective:   1. Primary hypertension BP stable at OV She denies CP with exertion PCP manages daily CCB- Amlodipine 5mg   2. Dyslipidemia Lipid Panel     Component Value Date/Time   CHOL 193 04/17/2023 1337   TRIG 133 04/17/2023 1337   TRIG 55 02/20/2006 0836   HDL 73 04/17/2023 1337   CHOLHDL 3 01/11/2023 1031   VLDL 22.8 01/11/2023 1031   LDLCALC 97 04/17/2023 1337   LDLCALC 86 11/18/2019 1334   LDLDIRECT 155.7 02/20/2006 0836   LABVLDL 23 04/17/2023 1337    PCP manages daily Atorvastatin 20mg   3. Vitamin D deficiency  Latest Reference Range & Units 04/17/23 13:37  Vitamin D, 25-Hydroxy 30.0 - 100.0 ng/mL 25.0 (L)  (L): Data is abnormally low  She is on weekly Ergocalciferol- denies N/V/Muscle Weakness  4.  Insulin resistance  Latest Reference Range & Units 04/17/23 13:37  Glucose 70 - 99 mg/dL 98  Hemoglobin U9W 4.8 - 5.6 % 5.2  Est. average glucose Bld gHb Est-mCnc mg/dL 119  INSULIN 2.6 - 14.7 uIU/mL 17.9   She is not currently on antidiabetic medications.  5.  Hx of Adnormal TSH  Latest Reference Range & Units 04/17/23 13:37 04/28/23 10:53  TSH 0.35 - 5.50 uIU/mL 5.070 (H) 2.05  Triiodothyronine,Free,Serum 2.3 - 4.2 pg/mL  3.1  T4,Free(Direct) 0.60 - 1.60 ng/dL 8.29 5.62  (H): Data is abnormally high  TSH level normalized She is not on Levothyroxine therapy  Assessment/Plan:   1. Primary hypertension (Primary) Limit Na" Try to check and record BP at home. Upper Arm BP cuff should be placed one inch above elbow for accurate readings  2. Dyslipidemia Limit sat fat and increase daily activity  3. Vitamin D deficiency Refill Vitamin D, Ergocalciferol, (DRISDOL) 1.25 MG (50000 UNIT) CAPS capsule Take 1 capsule (50,000 Units total) by mouth every 7 (seven) days. Dispense: 4 capsule, Refills: 0 ordered   4. Insulin resistance Limit sugar/simple CHO Increase protein intake 100 cal snack options provided to pt  5. Hx of Adnormal TSH Monitor labs and for sx's of hypothyroidism.  6.Obesity, Class II, BMI 35-39.9, Current BMI 35.0  [name] is currently in the action stage of change. As such, her  goal is to continue with weight loss efforts. She has agreed to the Category 1 Plan.   Exercise goals: Older adults should follow the adult guidelines. When older adults cannot meet the adult guidelines, they should be as physically active as their abilities and conditions will allow.  Older adults should do exercises that maintain or improve balance if they are at risk of falling.  Older adults should determine their level of effort for physical activity relative to their level of fitness.  Older adults with chronic conditions should understand whether and how their conditions affect their  ability to do regular physical activity safely.  Behavioral modification strategies: increasing lean protein intake, decreasing simple carbohydrates, increasing vegetables, increasing water intake, meal planning and cooking strategies, keeping healthy foods in the home, ways to avoid boredom eating, and planning for success.  [name] has agreed to follow-up with our clinic in 4 weeks. She was informed of the importance of frequent follow-up visits to maximize her success with intensive lifestyle modifications for her multiple health conditions.   Objective:   Blood pressure (!) 146/85, pulse 89, temperature 98.7 F (37.1 C), height 4\' 9"  (1.448 m), weight 161 lb (73 kg), SpO2 98%. Body mass index is 34.84 kg/m.  General: Cooperative, alert, well developed, in no acute distress. HEENT: Conjunctivae and lids unremarkable. Cardiovascular: Regular rhythm.  Lungs: Normal work of breathing. Neurologic: No focal deficits.   Lab Results  Component Value Date   CREATININE 0.62 04/17/2023   BUN 15 04/17/2023   NA 138 04/17/2023   K 4.3 04/17/2023   CL 96 04/17/2023   CO2 23 04/17/2023   Lab Results  Component Value Date   ALT 18 04/17/2023   AST 24 04/17/2023   ALKPHOS 123 (H) 04/17/2023   BILITOT 0.4 04/17/2023   Lab Results  Component Value Date   HGBA1C 5.2 04/17/2023   HGBA1C 5.3 01/11/2023   HGBA1C 5.2 12/28/2021   HGBA1C 5.3 12/02/2020   Lab Results  Component Value Date   INSULIN 17.9 04/17/2023   Lab Results  Component Value Date   TSH 2.05 04/28/2023   Lab Results  Component Value Date   CHOL 193 04/17/2023   HDL 73 04/17/2023   LDLCALC 97 04/17/2023   LDLDIRECT 155.7 02/20/2006   TRIG 133 04/17/2023   CHOLHDL 3 01/11/2023   Lab Results  Component Value Date   VD25OH 25.0 (L) 04/17/2023   Lab Results  Component Value Date   WBC 10.5 04/17/2023   HGB 13.1 04/17/2023   HCT 41.1 04/17/2023   MCV 92 04/17/2023   PLT 354 04/17/2023   No results found  for: "IRON", "TIBC", "FERRITIN"  Attestation Statements:   Reviewed by clinician on day of visit: allergies, medications, problem list, medical history, surgical history, family history, social history, and previous encounter notes.  I have reviewed the above documentation for accuracy and completeness, and I agree with the above. -  Delwyn Scoggin d. Bell Cai, NP-C

## 2023-05-25 ENCOUNTER — Ambulatory Visit: Payer: Self-pay | Admitting: Family Medicine

## 2023-05-25 ENCOUNTER — Inpatient Hospital Stay: Admission: RE | Admit: 2023-05-25 | Payer: Medicare PPO | Source: Ambulatory Visit

## 2023-05-25 NOTE — Telephone Encounter (Signed)
 Copied from CRM [id number]. Topic: Clinical - Red Word Triage >> May 25, 2023  3:06 PM Denese Killings wrote: Red Word that prompted transfer to Nurse Triage: Patient has back pain. She said it hurts while sitting down and she feels very fatigue.   Chief Complaint: Back pain Symptoms: Mid-back pain, fatigue  Frequency: Constant  Pertinent Negatives: Patient denies numbness or tingling Disposition: [] ED /[] Urgent Care (no appt availability in office) / [x] Appointment(In office/virtual)/ []  [facility]/ [] Home Care/ [] Refused Recommended Disposition /[] [facility]/ []  Follow-up with PCP Additional Notes: Patient reports she has had mid back pain for the last week. She states that her pain is worse when sitting down and improves when she is laying down. She denies any injury or stress to her back. Patient reports she has taken Tylenol arthritis without relief. Appointment made for the patient with Dr. Salomon Fick tomorrow.     Reason for Disposition  [1] MODERATE back pain (e.g., interferes with normal activities) AND [2] present > 3 days  Answer Assessment - Initial Assessment Questions 1. ONSET: "When did the pain begin?"      A week ago 2. LOCATION: "Where does it hurt?" (upper, mid or lower back)     Upper middle back   3. SEVERITY: "How bad is the pain?"  (e.g., Scale 1-10; mild, moderate, or severe)   - MILD (1-3): Doesn't interfere with normal activities.    - MODERATE (4-7): Interferes with normal activities or awakens from sleep.    - SEVERE (8-10): Excruciating pain, unable to do any normal activities.      8/10 4. PATTERN: "Is the pain constant?" (e.g., yes, no; constant, intermittent)      Constant  5. RADIATION: "Does the pain shoot into your legs or somewhere else?"     No 6. CAUSE:  "What do you think is causing the back pain?"      Unsure  7. BACK OVERUSE:  "Any recent lifting of heavy objects, strenuous work or exercise?"     No 8. MEDICINES: "What have you  taken so far for the pain?" (e.g., nothing, acetaminophen, NSAIDS)     Tylenol arthritis with no relief  9. NEUROLOGIC SYMPTOMS: "Do you have any weakness, numbness, or problems with bowel/bladder control?"     No 10. OTHER SYMPTOMS: "Do you have any other symptoms?" (e.g., fever, abdomen pain, burning with urination, blood in urine)       Fatigue  Protocols used: Back Pain-A-AH

## 2023-05-26 ENCOUNTER — Encounter: Payer: Self-pay | Admitting: Family Medicine

## 2023-05-26 ENCOUNTER — Ambulatory Visit: Payer: Medicare PPO | Admitting: Family Medicine

## 2023-05-26 ENCOUNTER — Ambulatory Visit: Payer: Medicare PPO

## 2023-05-26 VITALS — BP 152/74 | HR 80 | Temp 98.5°F | Ht <= 58 in | Wt 167.2 lb

## 2023-05-26 DIAGNOSIS — G8929 Other chronic pain: Secondary | ICD-10-CM | POA: Diagnosis not present

## 2023-05-26 DIAGNOSIS — I10 Essential (primary) hypertension: Secondary | ICD-10-CM

## 2023-05-26 DIAGNOSIS — M546 Pain in thoracic spine: Secondary | ICD-10-CM

## 2023-05-26 DIAGNOSIS — M51369 Other intervertebral disc degeneration, lumbar region without mention of lumbar back pain or lower extremity pain: Secondary | ICD-10-CM | POA: Diagnosis not present

## 2023-05-26 DIAGNOSIS — M438X4 Other specified deforming dorsopathies, thoracic region: Secondary | ICD-10-CM | POA: Diagnosis not present

## 2023-05-26 DIAGNOSIS — M47814 Spondylosis without myelopathy or radiculopathy, thoracic region: Secondary | ICD-10-CM | POA: Diagnosis not present

## 2023-05-26 LAB — POCT URINALYSIS DIPSTICK
Bilirubin, UA: NEGATIVE
Blood, UA: NEGATIVE
Glucose, UA: NEGATIVE
Ketones, UA: NEGATIVE
Leukocytes, UA: NEGATIVE
Nitrite, UA: NEGATIVE
Protein, UA: POSITIVE — AB
Spec Grav, UA: 1.02 (ref 1.010–1.025)
Urobilinogen, UA: 0.2 U/dL
pH, UA: 6 (ref 5.0–8.0)

## 2023-05-26 MED ORDER — MELOXICAM 15 MG PO TABS: 15.0000 mg | ORAL_TABLET | Freq: Every day | ORAL | 0 refills | Status: DC

## 2023-05-26 MED ORDER — CYCLOBENZAPRINE HCL 5 MG PO TABS: 5.0000 mg | ORAL_TABLET | Freq: Every evening | ORAL | 0 refills | Status: AC | PRN

## 2023-05-26 NOTE — Progress Notes (Signed)
 Established Patient Office Visit   Subjective  Patient ID: Robin Howe, female    DOB: 05/10/1950  Age: 73 y.o. MRN: [id number]  Chief Complaint  Patient presents with   Back Pain    Pt c/o mid back pain for a wk. Denied of any injury or fall, fever.     Pt is a 73 yo female followed by Dr. Clent Ridges and seen for acute on chronic issue.  Patient endorses mid back fatigue and achiness x 1 week.  Patient denies any changes in her normal routine.  States back feels better with standing.  Has also noticed an occasional weakness in lower extremities where she felt like she needed to sit down.  Symptoms ease but at times feels wobbly, jittery in extremities.  Patient states she has had this happen numerous times before.  Feels like she has a "nerve problem".  Tried Tylenol arthritis strength and gabapentin for symptoms, not sure they helped.  In PT for right knee.  History of lumbar spinal fusion and cervical nerve ablation.    Patient Active Problem List   Diagnosis Date Noted   Shingles of eyelid 07/30/2021   Eye pain, right 07/30/2021   Chronic headaches 01/04/2021   Chronic neck pain 04/24/2019   Insomnia 08/23/2016   Allergic rhinitis 11/22/2013   HTN (hypertension) 06/04/2013   NEPHROLITHIASIS 01/13/2010   LUNG NODULE 12/25/2009   MORBID OBESITY 01/10/2008   Dyslipidemia 06/18/2007   Osteoarthritis 06/18/2007   Severe episode of recurrent major depressive disorder (HCC) 05/18/2007   GERD 01/10/2007   Past Medical History:  Diagnosis Date   Arthritis    Back pain    Colon polyps    Depression    GERD (gastroesophageal reflux disease)    Hyperlipidemia    Hypertension    Irritable bowel syndrome (IBS)    Joint pain    Kidney stones    sees Dr. Larey Dresser    Knee pain    Multiple lung nodules    both lung bases, appear benign, next scheduled CT will be march 2013   Nerve pain    Osteoarthritis    Past Surgical History:  Procedure Laterality Date   BREAST  REDUCTION SURGERY     bilateral    CARPAL TUNNEL RELEASE     bilateral, per Dr. Mikal Plane   CATARACT EXTRACTION, BILATERAL Bilateral 2018   CESAREAN SECTION     CHOLECYSTECTOMY     COLONOSCOPY  04/20/2021   per Dr. Russella Dar, adenomatous polyps, repeat in 5 yrs   LUMBAR FUSION  2006   per Dr. Franky Macho   LUMBAR LAMINECTOMY     Social History   Tobacco Use   Smoking status: Never   Smokeless tobacco: Never  Vaping Use   Vaping status: Never Used  Substance Use Topics   Alcohol use: Not Currently    Comment: rare   Drug use: No   Family History  Problem Relation Age of Onset   Colon cancer Mother        48   Stroke Mother    High blood pressure Father    Alzheimer's disease Sister    GER disease Son    Esophageal cancer Neg Hx    Rectal cancer Neg Hx    Stomach cancer Neg Hx    Allergies  Allergen Reactions   Bextra [Valdecoxib]     rash   Lisinopril     Cough       ROS Negative unless stated above  Objective:     BP (!) 150/80 (BP Location: Left Arm, Patient Position: Sitting, Cuff Size: Normal)   Pulse 80   Temp 98.5 F (36.9 C) (Oral)   Ht 4\' 9"  (1.448 m)   Wt 167 lb 3.2 oz (75.8 kg)   SpO2 97%   BMI 36.18 kg/m  BP Readings from Last 3 Encounters:  05/26/23 (!) 150/80  05/22/23 (!) 146/85  05/01/23 126/73   Wt Readings from Last 3 Encounters:  05/26/23 167 lb 3.2 oz (75.8 kg)  05/22/23 161 lb (73 kg)  05/01/23 166 lb (75.3 kg)      Physical Exam Constitutional:      Appearance: Normal appearance. She is obese.  HENT:     Head: Normocephalic and atraumatic.     Mouth/Throat:     Mouth: Mucous membranes are moist.  Cardiovascular:     Rate and Rhythm: Normal rate.  Pulmonary:     Effort: Pulmonary effort is normal.  Musculoskeletal:     Cervical back: Normal.     Thoracic back: Normal.     Lumbar back: Normal.     Comments: Full range of motion of lumbar spine.  Unable to reproduce symptoms.  Skin:    General: Skin is warm and dry.   Neurological:     Mental Status: She is alert.     Results for orders placed or performed in visit on 05/26/23  POC Urinalysis Dipstick  Result Value Ref Range   Color, UA Yellow    Clarity, UA Clear    Glucose, UA Negative Negative   Bilirubin, UA neg    Ketones, UA neg    Spec Grav, UA 1.020 1.010 - 1.025   Blood, UA neg    pH, UA 6.0 5.0 - 8.0   Protein, UA Positive (A) Negative   Urobilinogen, UA 0.2 0.2 or 1.0 E.U./dL   Nitrite, UA neg    Leukocytes, UA Negative Negative   Appearance     Odor        Assessment & Plan:  Chronic bilateral thoracic back pain -     POCT urinalysis dipstick -     DG Thoracic Spine 2 View; Future -     Meloxicam; Take 1 tablet (15 mg total) by mouth daily.  Dispense: 15 tablet; Refill: 0 -     Cyclobenzaprine HCl; Take 1 tablet (5 mg total) by mouth at bedtime as needed.  Dispense: 15 tablet; Refill: 0  Essential hypertension  Patient with acute on chronic thoracic back symptoms of fatigue and achiness x 1 week.  Difficult to fully assess symptoms given description.  Recent labs reviewed.  Discussed causes including muscle strain, arthritis in spine, incorrectly fitting bra, renal calculi.  Patient advised to work on posture and continue supportive care including heat.  Will obtain imaging.  Muscle relaxer sparingly as needed as may cause drowsiness.  Mobic for pain/discomfort.  Patient encouraged to follow-up with PCP and surgeon for continued or worsening symptoms.  BP.  Not currently on meds.  Return if symptoms worsen or fail to improve.   Deeann Saint, MD

## 2023-05-26 NOTE — Patient Instructions (Addendum)
 A prescription for muscle relaxer Flexeril was sent to your pharmacy.  You can take this as needed however know that it may cause drowsiness.  A prescription for Mobic was also sent to your pharmacy to help with the pain.  An order for an x-ray of your thoracic spine was also placed.  You can have this done in clinic.  Your recent lab work was reviewed.  It is recommended that you follow-up with your PCP for continued symptoms.

## 2023-05-29 ENCOUNTER — Ambulatory Visit (INDEPENDENT_AMBULATORY_CARE_PROVIDER_SITE_OTHER)
Admission: RE | Admit: 2023-05-29 | Discharge: 2023-05-29 | Disposition: A | Discharge status: Home / Self Care | Payer: Medicare PPO | Source: Ambulatory Visit | Attending: Family Medicine | Admitting: Family Medicine

## 2023-05-29 ENCOUNTER — Other Ambulatory Visit: Payer: Self-pay | Admitting: Family Medicine

## 2023-05-29 DIAGNOSIS — M150 Primary generalized (osteo)arthritis: Secondary | ICD-10-CM

## 2023-05-29 DIAGNOSIS — M810 Age-related osteoporosis without current pathological fracture: Secondary | ICD-10-CM

## 2023-05-29 NOTE — Telephone Encounter (Signed)
 Increase the Amlodipine to 2 tablets daily (10 mg) for a week and then report back

## 2023-05-30 ENCOUNTER — Ambulatory Visit: Payer: Medicare PPO | Admitting: Family Medicine

## 2023-05-30 DIAGNOSIS — M1711 Unilateral primary osteoarthritis, right knee: Secondary | ICD-10-CM | POA: Diagnosis not present

## 2023-05-30 NOTE — Telephone Encounter (Signed)
 Pt had a visit at the office with Dr Salomon Fick on 05/26/23

## 2023-05-31 NOTE — Telephone Encounter (Signed)
 Spoke with pt advised of Dr Clent Ridges recommendation pt verbalized understanding

## 2023-05-31 NOTE — Telephone Encounter (Signed)
 Done

## 2023-06-01 ENCOUNTER — Telehealth: Payer: Self-pay | Admitting: *Deleted

## 2023-06-01 DIAGNOSIS — M1711 Unilateral primary osteoarthritis, right knee: Secondary | ICD-10-CM | POA: Diagnosis not present

## 2023-06-01 NOTE — Telephone Encounter (Signed)
 Copied from CRM [id number]. Topic: Clinical - Lab/Test Results >> Jun 01, 2023  3:22 PM Gurney Maxin H wrote: Reason for CRM: Patient was calling in to check the status of her xray results from 2/21, please reach out to patient, thanks.  Lukisha [phone]

## 2023-06-02 NOTE — Telephone Encounter (Signed)
 Reviewed Bone density report with pt verbalized understanding

## 2023-06-05 ENCOUNTER — Telehealth: Payer: Self-pay

## 2023-06-05 ENCOUNTER — Ambulatory Visit (INDEPENDENT_AMBULATORY_CARE_PROVIDER_SITE_OTHER): Payer: Medicare PPO | Admitting: Family Medicine

## 2023-06-05 NOTE — Telephone Encounter (Signed)
 Pt has appointment with Dr Clent Ridges at the office tomorrow

## 2023-06-05 NOTE — Telephone Encounter (Signed)
 Copied from CRM [id number]. Topic: Clinical - Lab/Test Results >> Jun 05, 2023 10:19 AM Almira Coaster wrote: Reason for CRM: Patient is calling regarding results for an x-ray done on 05/26/2023, advised patient of the delay imaging has been having lately. Patient would like to know if there is anything we can do to get expedited the results.

## 2023-06-06 ENCOUNTER — Ambulatory Visit: Admitting: Family Medicine

## 2023-06-06 ENCOUNTER — Encounter: Payer: Self-pay | Admitting: Family Medicine

## 2023-06-06 VITALS — BP 128/64 | HR 89 | Temp 98.5°F | Wt 169.0 lb

## 2023-06-06 DIAGNOSIS — I10 Essential (primary) hypertension: Secondary | ICD-10-CM

## 2023-06-06 DIAGNOSIS — M1711 Unilateral primary osteoarthritis, right knee: Secondary | ICD-10-CM | POA: Diagnosis not present

## 2023-06-06 DIAGNOSIS — F418 Other specified anxiety disorders: Secondary | ICD-10-CM | POA: Diagnosis not present

## 2023-06-06 MED ORDER — SERTRALINE HCL 50 MG PO TABS: 50.0000 mg | ORAL_TABLET | Freq: Every day | ORAL | 3 refills | Status: DC

## 2023-06-06 MED ORDER — ALPRAZOLAM 0.5 MG PO TABS: 0.5000 mg | ORAL_TABLET | Freq: Every evening | ORAL | 5 refills | Status: DC | PRN

## 2023-06-06 NOTE — Progress Notes (Signed)
   Subjective:    Patient ID: Robin Howe, female    DOB: April 07, 1950, 73 y.o.   MRN: [id number]  HPI Here to follow up on high stress levels and elevated BP. She has been dealing with anxiety off and on for years, and recently she has been using Xanax at night for sleep. She also relates some depression symptoms like fatigue, feeling sad, and poor motivation. Of note she took Zoloft for awhile after her husband passed away, and this worked well for her.    Review of Systems  Constitutional: Negative.   Respiratory: Negative.    Cardiovascular: Negative.   Psychiatric/Behavioral:  Positive for decreased concentration, dysphoric mood and sleep disturbance. Negative for agitation, behavioral problems, confusion, hallucinations, self-injury and suicidal ideas. The patient is nervous/anxious.        Objective:   Physical Exam Constitutional:      Appearance: Normal appearance.  Cardiovascular:     Rate and Rhythm: Normal rate and regular rhythm.     Pulses: Normal pulses.     Heart sounds: Normal heart sounds.  Pulmonary:     Effort: Pulmonary effort is normal.     Breath sounds: Normal breath sounds.  Neurological:     Mental Status: She is alert and oriented to person, place, and time. Mental status is at baseline.  Psychiatric:        Mood and Affect: Mood normal.        Behavior: Behavior normal.        Thought Content: Thought content normal.           Assessment & Plan:  Her BP is actually well controlled, but her anxiety probably makes it go up at times. We will watch this closely. She is dealing with depression and anxiety, so she will start back on Zoloft 50 mg daily. Recheck in 2 weeks.  Gershon Crane, MD

## 2023-06-07 ENCOUNTER — Ambulatory Visit: Payer: Medicare PPO | Admitting: Family Medicine

## 2023-06-08 ENCOUNTER — Encounter (INDEPENDENT_AMBULATORY_CARE_PROVIDER_SITE_OTHER): Payer: Self-pay

## 2023-06-14 ENCOUNTER — Encounter: Payer: Self-pay | Admitting: Family Medicine

## 2023-06-14 ENCOUNTER — Telehealth: Payer: Self-pay

## 2023-06-14 ENCOUNTER — Telehealth (INDEPENDENT_AMBULATORY_CARE_PROVIDER_SITE_OTHER): Admitting: Family Medicine

## 2023-06-14 DIAGNOSIS — B349 Viral infection, unspecified: Secondary | ICD-10-CM

## 2023-06-14 DIAGNOSIS — R300 Dysuria: Secondary | ICD-10-CM | POA: Diagnosis not present

## 2023-06-14 DIAGNOSIS — M545 Low back pain, unspecified: Secondary | ICD-10-CM

## 2023-06-14 MED ORDER — NITROFURANTOIN MONOHYD MACRO 100 MG PO CAPS: 100.0000 mg | ORAL_CAPSULE | Freq: Two times a day (BID) | ORAL | 0 refills | Status: DC

## 2023-06-14 NOTE — Telephone Encounter (Signed)
 We need her to schedule another Xray with [place] to look at this area. I put in orders for lumbar spine Xrays

## 2023-06-14 NOTE — Progress Notes (Signed)
 Subjective:    Patient ID: Robin Howe, female    DOB: [birth year], 73 y.o.   MRN: [id number]  HPI Virtual Visit via Video Note  I connected with the patient on 06/14/23 at  8:15 AM EDT by a video enabled telemedicine application and verified that I am speaking with the correct person using two identifiers.  Location patient: home Location provider:work or home office Persons participating in the virtual visit: patient, provider  I discussed the limitations of evaluation and management by telemedicine and the availability of in person appointments. The patient expressed understanding and agreed to proceed.   HPI: Here for several symptoms. For the past 3 days she has felt extremely fatigued, she has had a headache, and her voice has been hoarse. No cough or GI symptoms. She has tested negative for Covid twice. Drinking fluids and taking Tylenol. Now for the past 2 days she has also had urinary urgency and burning.    ROS: See pertinent positives and negatives per HPI.  Past Medical History:  Diagnosis Date   Arthritis    Back pain    Colon polyps    Depression    GERD (gastroesophageal reflux disease)    Hyperlipidemia    Hypertension    Irritable bowel syndrome (IBS)    Joint pain    Kidney stones    sees Dr. Larey Dresser    Knee pain    Multiple lung nodules    both lung bases, appear benign, next scheduled CT will be march 2013   Nerve pain    Osteoarthritis     Past Surgical History:  Procedure Laterality Date   BREAST REDUCTION SURGERY     bilateral    CARPAL TUNNEL RELEASE     bilateral, per Dr. Mikal Plane   CATARACT EXTRACTION, BILATERAL Bilateral 2018   CESAREAN SECTION     CHOLECYSTECTOMY     COLONOSCOPY  04/20/2021   per Dr. Russella Dar, adenomatous polyps, repeat in 5 yrs   LUMBAR FUSION  2006   per Dr. Franky Macho   LUMBAR LAMINECTOMY      Family History  Problem Relation Age of Onset   Colon cancer Mother        39   Stroke Mother    High blood  pressure Father    Alzheimer's disease Sister    GER disease Son    Esophageal cancer Neg Hx    Rectal cancer Neg Hx    Stomach cancer Neg Hx      Current Outpatient Medications:    ALPRAZolam (XANAX) 0.5 MG tablet, Take 1 tablet (0.5 mg total) by mouth at bedtime as needed for sleep., Disp: 30 tablet, Rfl: 5   amLODipine (NORVASC) 5 MG tablet, TAKE 1 TABLET BY MOUTH EVERY DAY (Patient taking differently: Take 5 mg by mouth daily. Taking 2 tablets (Total 10 mg) daily), Disp: 90 tablet, Rfl: 3   atorvastatin (LIPITOR) 20 MG tablet, TAKE 1 TABLET BY MOUTH EVERY DAY, Disp: 90 tablet, Rfl: 3   Cyanocobalamin (VITAMIN B-12 PO), Take by mouth daily., Disp: , Rfl:    cyclobenzaprine (FLEXERIL) 5 MG tablet, Take 1 tablet (5 mg total) by mouth at bedtime as needed., Disp: 15 tablet, Rfl: 0   Ferrous Sulfate (IRON PO), Take by mouth. Takes 10 days prior to giving blood, Disp: , Rfl:    gabapentin (NEURONTIN) 300 MG capsule, TAKE 1 CAPSULE BY MOUTH EVERYDAY AT BEDTIME (Patient taking differently: as needed. TAKE 1 CAPSULE BY MOUTH EVERYDAY AT  BEDTIME), Disp: 90 capsule, Rfl: 1   meloxicam (MOBIC) 15 MG tablet, TAKE 1 TABLET (15 MG TOTAL) BY MOUTH DAILY., Disp: 90 tablet, Rfl: 1   meloxicam (MOBIC) 15 MG tablet, Take 1 tablet (15 mg total) by mouth daily., Disp: 15 tablet, Rfl: 0   sertraline (ZOLOFT) 50 MG tablet, Take 1 tablet (50 mg total) by mouth daily., Disp: 30 tablet, Rfl: 3   Vitamin D, Ergocalciferol, (DRISDOL) 1.25 MG (50000 UNIT) CAPS capsule, Take 1 capsule (50,000 Units total) by mouth every 7 (seven) days., Disp: 4 capsule, Rfl: 0  EXAM:  VITALS per patient if applicable:  GENERAL: alert, oriented, appears well and in no acute distress  HEENT: atraumatic, conjunttiva clear, no obvious abnormalities on inspection of external nose and ears  NECK: normal movements of the head and neck  LUNGS: on inspection no signs of respiratory distress, breathing rate appears normal, no obvious  gross SOB, gasping or wheezing  CV: no obvious cyanosis  MS: moves all visible extremities without noticeable abnormality  PSYCH/NEURO: pleasant and cooperative, no obvious depression or anxiety, speech and thought processing grossly intact  ASSESSMENT AND PLAN: She seems to have two infections going on at the same time. One is a viral illness of some sort, and the other is a UTI. She will take 7 days of Macrobid. Recheck as needed.  Gershon Crane, MD  Discussed the following assessment and plan:  No diagnosis found.     I discussed the assessment and treatment plan with the patient. The patient was provided an opportunity to ask questions and all were answered. The patient agreed with the plan and demonstrated an understanding of the instructions.   The patient was advised to call back or seek an in-person evaluation if the symptoms worsen or if the condition fails to improve as anticipated.      Review of Systems     Objective:   Physical Exam        Assessment & Plan:

## 2023-06-14 NOTE — Telephone Encounter (Signed)
 FYI Spoke with pt regarding Dr Clent Ridges recommendation pt states that she has appointment with Dr Clent Ridges on the 06/21/23 and she can get the x-rays done then.

## 2023-06-15 ENCOUNTER — Other Ambulatory Visit (INDEPENDENT_AMBULATORY_CARE_PROVIDER_SITE_OTHER): Payer: Self-pay | Admitting: Adult Health

## 2023-06-15 DIAGNOSIS — E559 Vitamin D deficiency, unspecified: Secondary | ICD-10-CM

## 2023-06-21 ENCOUNTER — Ambulatory Visit: Admitting: Family Medicine

## 2023-06-21 ENCOUNTER — Encounter: Payer: Self-pay | Admitting: Family Medicine

## 2023-06-21 VITALS — BP 128/78 | HR 79 | Temp 98.1°F | Wt 165.0 lb

## 2023-06-21 DIAGNOSIS — G8929 Other chronic pain: Secondary | ICD-10-CM | POA: Diagnosis not present

## 2023-06-21 DIAGNOSIS — M542 Cervicalgia: Secondary | ICD-10-CM

## 2023-06-21 NOTE — Progress Notes (Signed)
   Subjective:    Patient ID: Robin Howe, female    DOB: May 02, 1950, 73 y.o.   MRN: [id number]  HPI Here for pain in the lower neck and upper back. She has had neck pain for years, but this has been worse the past 3 months. She sometimes feels weakness in both arms, but she never has pain in the arms. She takes Arthritis Strength Tylenol, and she sometimes adds Gabapentin to this. She had an MRI of the cervical spine in 2021 showing degenerative discs and bone spurs, but no disc herniations were seen. She also had thoracic spine Xrays here last month showing degenerative discs.    Review of Systems  Constitutional: Negative.   Respiratory: Negative.    Cardiovascular: Negative.   Musculoskeletal:  Positive for back pain and neck pain.       Objective:   Physical Exam Constitutional:      Appearance: Normal appearance.  Cardiovascular:     Rate and Rhythm: Normal rate and regular rhythm.     Pulses: Normal pulses.     Heart sounds: Normal heart sounds.  Pulmonary:     Effort: Pulmonary effort is normal.     Breath sounds: Normal breath sounds.  Musculoskeletal:     Comments: She is quite tender over the posterior spine at the C6 and C7 level. ROM is limited by pain   Neurological:     Mental Status: She is alert.           Assessment & Plan:  Chronic neck pain. We will send her to PT to work on this.  Gershon Crane, MD

## 2023-06-28 ENCOUNTER — Other Ambulatory Visit: Payer: Self-pay | Admitting: Family Medicine

## 2023-06-28 DIAGNOSIS — M542 Cervicalgia: Secondary | ICD-10-CM | POA: Diagnosis not present

## 2023-07-05 DIAGNOSIS — M542 Cervicalgia: Secondary | ICD-10-CM | POA: Diagnosis not present

## 2023-07-07 DIAGNOSIS — M542 Cervicalgia: Secondary | ICD-10-CM | POA: Diagnosis not present

## 2023-07-10 DIAGNOSIS — M542 Cervicalgia: Secondary | ICD-10-CM | POA: Diagnosis not present

## 2023-07-12 DIAGNOSIS — M542 Cervicalgia: Secondary | ICD-10-CM | POA: Diagnosis not present

## 2023-07-13 ENCOUNTER — Encounter: Payer: Self-pay | Admitting: Family Medicine

## 2023-07-13 ENCOUNTER — Other Ambulatory Visit: Payer: Self-pay

## 2023-07-14 MED ORDER — AMLODIPINE BESYLATE 10 MG PO TABS: 10.0000 mg | ORAL_TABLET | Freq: Every day | ORAL | 3 refills | Status: DC

## 2023-07-14 NOTE — Telephone Encounter (Signed)
 Done

## 2023-07-17 DIAGNOSIS — M542 Cervicalgia: Secondary | ICD-10-CM | POA: Diagnosis not present

## 2023-07-18 ENCOUNTER — Other Ambulatory Visit: Payer: Self-pay | Admitting: Family Medicine

## 2023-07-18 DIAGNOSIS — G8929 Other chronic pain: Secondary | ICD-10-CM

## 2023-07-19 DIAGNOSIS — M542 Cervicalgia: Secondary | ICD-10-CM | POA: Diagnosis not present

## 2023-07-24 ENCOUNTER — Encounter: Payer: Self-pay | Admitting: Family Medicine

## 2023-07-24 DIAGNOSIS — M542 Cervicalgia: Secondary | ICD-10-CM | POA: Diagnosis not present

## 2023-07-28 ENCOUNTER — Other Ambulatory Visit: Payer: Self-pay

## 2023-07-28 DIAGNOSIS — G8929 Other chronic pain: Secondary | ICD-10-CM

## 2023-07-28 MED ORDER — MELOXICAM 15 MG PO TABS: 15.0000 mg | ORAL_TABLET | Freq: Every day | ORAL | 0 refills | Status: DC

## 2023-08-01 DIAGNOSIS — M542 Cervicalgia: Secondary | ICD-10-CM | POA: Diagnosis not present

## 2023-08-03 DIAGNOSIS — M542 Cervicalgia: Secondary | ICD-10-CM | POA: Diagnosis not present

## 2023-08-08 DIAGNOSIS — M542 Cervicalgia: Secondary | ICD-10-CM | POA: Diagnosis not present

## 2023-08-10 DIAGNOSIS — M542 Cervicalgia: Secondary | ICD-10-CM | POA: Diagnosis not present

## 2023-08-21 DIAGNOSIS — H26492 Other secondary cataract, left eye: Secondary | ICD-10-CM | POA: Diagnosis not present

## 2023-08-21 DIAGNOSIS — Z961 Presence of intraocular lens: Secondary | ICD-10-CM | POA: Diagnosis not present

## 2023-09-26 ENCOUNTER — Ambulatory Visit: Admitting: Family Medicine

## 2023-09-26 ENCOUNTER — Encounter: Payer: Self-pay | Admitting: Family Medicine

## 2023-09-26 VITALS — BP 110/60 | HR 75 | Temp 98.5°F | Wt 160.2 lb

## 2023-09-26 DIAGNOSIS — M25472 Effusion, left ankle: Secondary | ICD-10-CM | POA: Diagnosis not present

## 2023-09-26 DIAGNOSIS — M25471 Effusion, right ankle: Secondary | ICD-10-CM | POA: Diagnosis not present

## 2023-09-26 DIAGNOSIS — I10 Essential (primary) hypertension: Secondary | ICD-10-CM | POA: Diagnosis not present

## 2023-09-26 MED ORDER — AMLODIPINE BESYLATE 10 MG PO TABS: 5.0000 mg | ORAL_TABLET | Freq: Every day | ORAL | Status: DC

## 2023-09-26 MED ORDER — SERTRALINE HCL 50 MG PO TABS: 50.0000 mg | ORAL_TABLET | Freq: Every day | ORAL | 3 refills | Status: AC

## 2023-09-26 MED ORDER — ATORVASTATIN CALCIUM 20 MG PO TABS: 20.0000 mg | ORAL_TABLET | Freq: Every day | ORAL | 3 refills | Status: AC

## 2023-09-26 NOTE — Progress Notes (Signed)
   Subjective:    Patient ID: Robin Howe, female    DOB: May 21, 1950, 73 y.o.   MRN: [id number]  HPI Here for one month of swelling in both ankles. No SOB. Her BP has been well controlled.    Review of Systems  Constitutional: Negative.   Respiratory: Negative.    Cardiovascular:  Positive for leg swelling. Negative for chest pain and palpitations.       Objective:   Physical Exam Constitutional:      Appearance: Normal appearance.   Cardiovascular:     Rate and Rhythm: Normal rate and regular rhythm.     Pulses: Normal pulses.     Heart sounds: Normal heart sounds.  Pulmonary:     Effort: Pulmonary effort is normal.     Breath sounds: Normal breath sounds.   Musculoskeletal:     Comments: 1+ edema in both ankles    Neurological:     Mental Status: She is alert.           Assessment & Plan:  Her HTN is stable, but the Amlodipine  is causing some fluid retention. We will decrease the dose of this to 5 mg daily. She will elevated her legs. Report back in 4 weeks.  Garnette Olmsted, MD

## 2023-09-26 NOTE — Addendum Note (Signed)
 Addended by: JOHNNY SENIOR A on: 09/26/2023 01:06 PM   Modules accepted: Level of Service

## 2023-10-22 ENCOUNTER — Encounter: Payer: Self-pay | Admitting: Family Medicine

## 2023-10-22 DIAGNOSIS — G8929 Other chronic pain: Secondary | ICD-10-CM

## 2023-10-23 NOTE — Telephone Encounter (Signed)
 I did the referral

## 2023-10-29 ENCOUNTER — Other Ambulatory Visit: Payer: Self-pay | Admitting: Family Medicine

## 2023-10-29 DIAGNOSIS — G8929 Other chronic pain: Secondary | ICD-10-CM

## 2023-11-01 ENCOUNTER — Encounter: Payer: Self-pay | Admitting: Family Medicine

## 2023-11-01 DIAGNOSIS — G8929 Other chronic pain: Secondary | ICD-10-CM

## 2023-11-02 NOTE — Telephone Encounter (Signed)
 I can't tell what happened but I just put in a new referral to [facility]

## 2023-11-08 DIAGNOSIS — M2560 Stiffness of unspecified joint, not elsewhere classified: Secondary | ICD-10-CM | POA: Diagnosis not present

## 2023-11-08 DIAGNOSIS — M542 Cervicalgia: Secondary | ICD-10-CM | POA: Diagnosis not present

## 2023-11-13 DIAGNOSIS — M542 Cervicalgia: Secondary | ICD-10-CM | POA: Diagnosis not present

## 2023-11-13 DIAGNOSIS — M2560 Stiffness of unspecified joint, not elsewhere classified: Secondary | ICD-10-CM | POA: Diagnosis not present

## 2023-11-15 DIAGNOSIS — M2560 Stiffness of unspecified joint, not elsewhere classified: Secondary | ICD-10-CM | POA: Diagnosis not present

## 2023-11-15 DIAGNOSIS — M542 Cervicalgia: Secondary | ICD-10-CM | POA: Diagnosis not present

## 2023-11-22 DIAGNOSIS — M2560 Stiffness of unspecified joint, not elsewhere classified: Secondary | ICD-10-CM | POA: Diagnosis not present

## 2023-11-22 DIAGNOSIS — M542 Cervicalgia: Secondary | ICD-10-CM | POA: Diagnosis not present

## 2023-11-27 ENCOUNTER — Encounter: Payer: Self-pay | Admitting: Family Medicine

## 2023-12-04 ENCOUNTER — Other Ambulatory Visit: Payer: Self-pay | Admitting: Family Medicine

## 2023-12-05 NOTE — Telephone Encounter (Signed)
 Pt LOV was on 09/26/23 Last refill done on 06/06/23 Please advise

## 2023-12-12 DIAGNOSIS — Z1231 Encounter for screening mammogram for malignant neoplasm of breast: Secondary | ICD-10-CM | POA: Diagnosis not present

## 2023-12-12 LAB — HM MAMMOGRAPHY

## 2023-12-15 ENCOUNTER — Encounter: Payer: Self-pay | Admitting: Family Medicine

## 2023-12-29 ENCOUNTER — Encounter: Payer: Self-pay | Admitting: Family Medicine

## 2023-12-29 MED ORDER — CIPROFLOXACIN HCL 500 MG PO TABS: 500.0000 mg | ORAL_TABLET | Freq: Two times a day (BID) | ORAL | 0 refills | Status: DC

## 2023-12-29 NOTE — Telephone Encounter (Signed)
 I sent in a RX for 7 days of Cipro 

## 2024-01-01 NOTE — Telephone Encounter (Signed)
 Pt aware.

## 2024-01-17 ENCOUNTER — Encounter: Payer: Self-pay | Admitting: Family Medicine

## 2024-01-17 ENCOUNTER — Ambulatory Visit (INDEPENDENT_AMBULATORY_CARE_PROVIDER_SITE_OTHER): Admitting: Family Medicine

## 2024-01-17 ENCOUNTER — Ambulatory Visit: Payer: Self-pay | Admitting: Family Medicine

## 2024-01-17 VITALS — BP 120/72 | HR 75 | Temp 98.2°F | Ht <= 58 in | Wt 165.0 lb

## 2024-01-17 DIAGNOSIS — M150 Primary generalized (osteo)arthritis: Secondary | ICD-10-CM

## 2024-01-17 DIAGNOSIS — E785 Hyperlipidemia, unspecified: Secondary | ICD-10-CM

## 2024-01-17 DIAGNOSIS — I10 Essential (primary) hypertension: Secondary | ICD-10-CM | POA: Diagnosis not present

## 2024-01-17 DIAGNOSIS — F418 Other specified anxiety disorders: Secondary | ICD-10-CM

## 2024-01-17 DIAGNOSIS — R739 Hyperglycemia, unspecified: Secondary | ICD-10-CM | POA: Diagnosis not present

## 2024-01-17 DIAGNOSIS — K219 Gastro-esophageal reflux disease without esophagitis: Secondary | ICD-10-CM

## 2024-01-17 DIAGNOSIS — G4700 Insomnia, unspecified: Secondary | ICD-10-CM | POA: Diagnosis not present

## 2024-01-17 LAB — LIPID PANEL
Cholesterol: 186 mg/dL (ref 0–200)
HDL: 64.2 mg/dL (ref >39.00)
LDL Cholesterol: 102 mg/dL — ABNORMAL HIGH (ref 0–99)
NonHDL: 121.57
Total CHOL/HDL Ratio: 3
Triglycerides: 98 mg/dL (ref 0.0–149.0)
VLDL: 19.6 mg/dL (ref 0.0–40.0)

## 2024-01-17 LAB — CBC WITH DIFFERENTIAL/PLATELET
Basophils Absolute: 0.1 K/uL (ref 0.0–0.1)
Basophils Relative: 1.4 % (ref 0.0–3.0)
Eosinophils Absolute: 0.3 K/uL (ref 0.0–0.7)
Eosinophils Relative: 4 % (ref 0.0–5.0)
HCT: 41.7 % (ref 36.0–46.0)
Hemoglobin: 13.9 g/dL (ref 12.0–15.0)
Lymphocytes Relative: 23.6 % (ref 12.0–46.0)
Lymphs Abs: 1.6 K/uL (ref 0.7–4.0)
MCHC: 33.3 g/dL (ref 30.0–36.0)
MCV: 90.7 fl (ref 78.0–100.0)
Monocytes Absolute: 0.4 K/uL (ref 0.1–1.0)
Monocytes Relative: 6 % (ref 3.0–12.0)
Neutro Abs: 4.5 K/uL (ref 1.4–7.7)
Neutrophils Relative %: 65 % (ref 43.0–77.0)
Platelets: 291 K/uL (ref 150.0–400.0)
RBC: 4.6 Mil/uL (ref 3.87–5.11)
RDW: 12.6 % (ref 11.5–15.5)
WBC: 6.9 K/uL (ref 4.0–10.5)

## 2024-01-17 LAB — BASIC METABOLIC PANEL WITH GFR
BUN: 17 mg/dL (ref 6–23)
CO2: 28 meq/L (ref 19–32)
Calcium: 9.9 mg/dL (ref 8.4–10.5)
Chloride: 101 meq/L (ref 96–112)
Creatinine, Ser: 0.74 mg/dL (ref 0.40–1.20)
GFR: 80.6 mL/min (ref >60.00)
Glucose, Bld: 104 mg/dL — ABNORMAL HIGH (ref 70–99)
Potassium: 4.2 meq/L (ref 3.5–5.1)
Sodium: 139 meq/L (ref 135–145)

## 2024-01-17 LAB — HEMOGLOBIN A1C: Hgb A1c MFr Bld: 5.3 % (ref 4.6–6.5)

## 2024-01-17 LAB — HEPATIC FUNCTION PANEL
ALT: 17 U/L (ref 0–35)
AST: 29 U/L (ref 0–37)
Albumin: 4.8 g/dL (ref 3.5–5.2)
Alkaline Phosphatase: 84 U/L (ref 39–117)
Bilirubin, Direct: 0.1 mg/dL (ref 0.0–0.3)
Total Bilirubin: 0.6 mg/dL (ref 0.2–1.2)
Total Protein: 7.9 g/dL (ref 6.0–8.3)

## 2024-01-17 LAB — TSH: TSH: 2.33 u[IU]/mL (ref 0.35–5.50)

## 2024-01-17 NOTE — Progress Notes (Signed)
 Subjective:    Patient ID: Robin Howe, female    DOB: 1950-09-15, 73 y.o.   MRN: [id number]  HPI Here to follow up on issues. She has no complaints. She had seen us  for ankle swelling several months ago, and we advised her to decrease the Amlodipine  to 1/2 a pill a day (5 mg), and the swelling resolved. Her BP has remained under control. Her OA is stable. Her depression with anxiety is stable, and she is sleeping well.    Review of Systems  Constitutional: Negative.   HENT: Negative.    Eyes: Negative.   Respiratory: Negative.    Cardiovascular: Negative.   Gastrointestinal: Negative.   Genitourinary:  Negative for decreased urine volume, difficulty urinating, dyspareunia, dysuria, enuresis, flank pain, frequency, hematuria, pelvic pain and urgency.  Musculoskeletal:  Positive for arthralgias.  Skin: Negative.   Neurological: Negative.  Negative for headaches.  Psychiatric/Behavioral: Negative.         Objective:   Physical Exam Constitutional:      General: She is not in acute distress.    Appearance: She is well-developed. She is obese.  HENT:     Head: Normocephalic and atraumatic.     Right Ear: External ear normal.     Left Ear: External ear normal.     Nose: Nose normal.     Mouth/Throat:     Pharynx: No oropharyngeal exudate.  Eyes:     General: No scleral icterus.    Conjunctiva/sclera: Conjunctivae normal.     Pupils: Pupils are equal, round, and reactive to light.  Neck:     Thyroid : No thyromegaly.     Vascular: No JVD.  Cardiovascular:     Rate and Rhythm: Normal rate and regular rhythm.     Pulses: Normal pulses.     Heart sounds: Normal heart sounds. No murmur heard.    No friction rub. No gallop.  Pulmonary:     Effort: Pulmonary effort is normal. No respiratory distress.     Breath sounds: Normal breath sounds. No wheezing or rales.  Chest:     Chest wall: No tenderness.  Abdominal:     General: Bowel sounds are normal. There is no  distension.     Palpations: Abdomen is soft. There is no mass.     Tenderness: There is no abdominal tenderness. There is no guarding or rebound.  Musculoskeletal:        General: No tenderness. Normal range of motion.     Cervical back: Normal range of motion and neck supple.  Lymphadenopathy:     Cervical: No cervical adenopathy.  Skin:    General: Skin is warm and dry.     Findings: No erythema or rash.  Neurological:     General: No focal deficit present.     Mental Status: She is alert and oriented to person, place, and time.     Cranial Nerves: No cranial nerve deficit.     Motor: No abnormal muscle tone.     Coordination: Coordination normal.     Deep Tendon Reflexes: Reflexes are normal and symmetric. Reflexes normal.  Psychiatric:        Mood and Affect: Mood normal.        Behavior: Behavior normal.        Thought Content: Thought content normal.        Judgment: Judgment normal.           Assessment & Plan:  Her HTN is well controlled,  and her ankle edema has resolved. Her OA is stable. Her depression with anxiety as well as her insomnia are stable. Her GERD is stable. We will get labs to check lipids, etc. I personally spent a total of 34 minutes in the care of the patient today including getting/reviewing separately obtained history, performing a medically appropriate exam/evaluation, counseling and educating, and placing orders.  Garnette Olmsted, MD

## 2024-03-14 ENCOUNTER — Ambulatory Visit: Admitting: Family Medicine

## 2024-03-15 ENCOUNTER — Ambulatory Visit: Payer: Self-pay | Admitting: Family Medicine

## 2024-03-15 ENCOUNTER — Ambulatory Visit: Admitting: Family Medicine

## 2024-03-15 ENCOUNTER — Encounter: Payer: Self-pay | Admitting: Family Medicine

## 2024-03-15 VITALS — BP 136/82 | HR 78 | Temp 98.1°F | Ht <= 58 in | Wt 168.6 lb

## 2024-03-15 DIAGNOSIS — E538 Deficiency of other specified B group vitamins: Secondary | ICD-10-CM | POA: Diagnosis not present

## 2024-03-15 DIAGNOSIS — G629 Polyneuropathy, unspecified: Secondary | ICD-10-CM

## 2024-03-15 LAB — VITAMIN B12: Vitamin B-12: 861 pg/mL (ref 211–911)

## 2024-03-15 MED ORDER — AMLODIPINE BESYLATE 10 MG PO TABS: 10.0000 mg | ORAL_TABLET | Freq: Every day | ORAL | Status: AC

## 2024-03-15 MED ORDER — GABAPENTIN 300 MG PO CAPS: 300.0000 mg | ORAL_CAPSULE | Freq: Every day | ORAL | 3 refills | Status: AC

## 2024-03-15 NOTE — Progress Notes (Signed)
° °  Subjective:    Patient ID: Barnie KATHEE Rams, female    DOB: 13-May-1950, 73 y.o.   MRN: [id number]  HPI Here complaining of intermittent numbness and tingling in both legs and both arms. This comes 2-3 times a week and it lasts for a few hours. Sometimes she feels an achy pain in the legs. No weakness. She still has stiffness and pain in the neck and lower back, but this does not correlate with the numbness spells. She had screening labs in October which were normal.    Review of Systems  Constitutional: Negative.   Respiratory: Negative.    Cardiovascular: Negative.   Musculoskeletal:  Positive for back pain and neck pain.  Neurological:  Positive for numbness. Negative for weakness.       Objective:   Physical Exam Constitutional:      Appearance: Normal appearance.  Cardiovascular:     Rate and Rhythm: Normal rate and regular rhythm.     Pulses: Normal pulses.     Heart sounds: Normal heart sounds.  Pulmonary:     Effort: Pulmonary effort is normal.     Breath sounds: Normal breath sounds.  Neurological:     Mental Status: She is alert.           Assessment & Plan:  She has intermittent numbness in the arms and legs. This sounds more like a neuropathy than the result of pinched nerves. We will check a B12 level today. She will try Gabapentin  300 mg at bedtime. Report back in several weeks. Garnette Olmsted, MD

## 2024-04-17 ENCOUNTER — Ambulatory Visit

## 2024-04-17 VITALS — BP 120/60 | HR 79 | Temp 98.2°F | Ht <= 58 in | Wt 171.2 lb

## 2024-04-17 DIAGNOSIS — Z0000 Encounter for general adult medical examination without abnormal findings: Secondary | ICD-10-CM

## 2024-04-17 NOTE — Patient Instructions (Addendum)
 Robin Howe,  Thank you for taking the time for your Medicare Wellness Visit. I appreciate your continued commitment to your health goals. Please review the care plan we discussed, and feel free to reach out if I can assist you further.  Please note that Annual Wellness Visits do not include a physical exam. Some assessments may be limited, especially if the visit was conducted virtually. If needed, we may recommend an in-person follow-up with your provider.  Ongoing Care Seeing your primary care provider every 3 to 6 months helps us  monitor your health and provide consistent, personalized care.   Referrals If a referral was made during today's visit and you haven't received any updates within two weeks, please contact the referred provider directly to check on the status.  Recommended Screenings:  Health Maintenance  Topic Date Due   Hepatitis C Screening  Never done   Zoster (Shingles) Vaccine (2 of 2) 11/11/2022   Flu Shot  11/03/2023   DTaP/Tdap/Td vaccine (2 - Td or Tdap) 11/23/2023   COVID-19 Vaccine (8 - 2025-26 season) 07/23/2024   Medicare Annual Wellness Visit  04/17/2025   Breast Cancer Screening  12/11/2025   Colon Cancer Screening  04/20/2026   Pneumococcal Vaccine for age over 23  Completed   Osteoporosis screening with Bone Density Scan  Completed   Meningitis B Vaccine  Aged Out       04/17/2024    1:28 PM  Advanced Directives  Does Patient Have a Medical Advance Directive? Yes  Type of Estate Agent of [place];Living will  Does patient want to make changes to medical advance directive? No - Patient declined  Copy of Healthcare Power of Attorney in Chart? No - copy requested    Vision: Annual vision screenings are recommended for early detection of glaucoma, cataracts, and diabetic retinopathy. These exams can also reveal signs of chronic conditions such as diabetes and high blood pressure.  Dental: Annual dental screenings help detect early  signs of oral cancer, gum disease, and other conditions linked to overall health, including heart disease and diabetes.  Please see the attached documents for additional preventive care recommendations.

## 2024-04-17 NOTE — Progress Notes (Signed)
 "  Chief Complaint  Patient presents with   Medicare Wellness     Subjective:   Robin Howe is a 74 y.o. female who presents for a Medicare Annual Wellness Visit.  Visit info / Clinical Intake: Medicare Wellness Visit Type:: Subsequent Annual Wellness Visit Persons participating in visit and providing information:: patient Medicare Wellness Visit Mode:: In-person (required for WTM) Interpreter Needed?: No Pre-visit prep was completed: yes AWV questionnaire completed by patient prior to visit?: yes Date:: 04/16/24 Living arrangements:: (!) lives alone Patient's Overall Health Status Rating: good Typical amount of pain: some Does pain affect daily life?: no Are you currently prescribed opioids?: no  Dietary Habits and Nutritional Risks How many meals a day?: 3 Eats fruit and vegetables daily?: yes Most meals are obtained by: preparing own meals In the last 2 weeks, have you had any of the following?: none Diabetic:: no  Functional Status Activities of Daily Living (to include ambulation/medication): Independent Ambulation: Independent with device- listed below Home Assistive Devices/Equipment: Eyeglasses Medication Administration: Independent Home Management (perform basic housework or laundry): Independent Manage your own finances?: yes Primary transportation is: driving Concerns about vision?: no *vision screening is required for WTM* Concerns about hearing?: no  Fall Screening Falls in the past year?: 0 Number of falls in past year: 0 Was there an injury with Fall?: 0 Fall Risk Category Calculator: 0 Patient Fall Risk Level: Low Fall Risk  Fall Risk Patient at Risk for Falls Due to: No Fall Risks Fall risk Follow up: Falls evaluation completed  Home and Transportation Safety: All rugs have non-skid backing?: yes All stairs or steps have railings?: yes Grab bars in the bathtub or shower?: yes Have non-skid surface in bathtub or shower?: yes Good home  lighting?: yes Regular seat belt use?: yes Hospital stays in the last year:: no  Cognitive Assessment Difficulty concentrating, remembering, or making decisions? : no Will 6CIT or Mini Cog be Completed: yes What year is it?: 0 points What month is it?: 0 points Give patient an address phrase to remember (5 components): [address] [place]  About what time is it?: 0 points Count backwards from 20 to 1: 0 points Say the months of the year in reverse: 0 points Repeat the address phrase from earlier: 0 points 6 CIT Score: 0 points  Advance Directives (For Healthcare) Does Patient Have a Medical Advance Directive?: Yes Does patient want to make changes to medical advance directive?: No - Patient declined Type of Advance Directive: Healthcare Power of [place]; Living will Copy of Healthcare Power of Attorney in Chart?: No - copy requested Copy of Living Will in Chart?: No - copy requested  Reviewed/Updated  Reviewed/Updated: Reviewed All (Medical, Surgical, Family, Medications, Allergies, Care Teams, Patient Goals)    Allergies (verified) Bextra [valdecoxib] and Lisinopril    Current Medications (verified) Outpatient Encounter Medications as of 04/17/2024  Medication Sig   ALPRAZolam  (XANAX ) 0.5 MG tablet TAKE 1 TABLET (0.5 MG TOTAL) BY MOUTH AT BEDTIME AS NEEDED FOR SLEEP.   amLODipine  (NORVASC ) 10 MG tablet Take 1 tablet (10 mg total) by mouth daily.   atorvastatin  (LIPITOR) 20 MG tablet Take 1 tablet (20 mg total) by mouth daily.   Cyanocobalamin  (VITAMIN B-12 PO) Take by mouth daily.   cyclobenzaprine  (FLEXERIL ) 5 MG tablet Take 1 tablet (5 mg total) by mouth at bedtime as needed.   Ferrous Sulfate (IRON PO) Take by mouth. Takes 10 days prior to giving blood   gabapentin  (NEURONTIN ) 300 MG capsule Take  1 capsule (300 mg total) by mouth at bedtime.   meloxicam  (MOBIC ) 15 MG tablet TAKE 1 TABLET (15 MG TOTAL) BY MOUTH DAILY.   sertraline  (ZOLOFT ) 50 MG tablet Take 1  tablet (50 mg total) by mouth daily.   Vitamin D , Ergocalciferol , (DRISDOL ) 1.25 MG (50000 UNIT) CAPS capsule Take 1 capsule (50,000 Units total) by mouth every 7 (seven) days.   No facility-administered encounter medications on file as of 04/17/2024.    History: Past Medical History:  Diagnosis Date   Arthritis    Back pain    Colon polyps    Depression    GERD (gastroesophageal reflux disease)    Hyperlipidemia    Hypertension    Irritable bowel syndrome (IBS)    Joint pain    Kidney stones    sees Dr. Willma Endo    Knee pain    Multiple lung nodules    both lung bases, appear benign, next scheduled CT will be march 2013   Nerve pain    Osteoarthritis    Past Surgical History:  Procedure Laterality Date   BREAST REDUCTION SURGERY     bilateral    CARPAL TUNNEL RELEASE     bilateral, per Dr. Lindalee   CATARACT EXTRACTION, BILATERAL Bilateral 2018   CESAREAN SECTION     CHOLECYSTECTOMY     COLONOSCOPY  04/20/2021   per Dr. Aneita, adenomatous polyps, repeat in 5 yrs   LUMBAR FUSION  2006   per Dr. Gillie   LUMBAR LAMINECTOMY     Family History  Problem Relation Age of Onset   Colon cancer Mother        36   Stroke Mother    High blood pressure Father    Alzheimer's disease Sister    GER disease Son    Esophageal cancer Neg Hx    Rectal cancer Neg Hx    Stomach cancer Neg Hx    Social History   Occupational History   Occupation: retired  Tobacco Use   Smoking status: Never   Smokeless tobacco: Never  Vaping Use   Vaping status: Never Used  Substance and Sexual Activity   Alcohol use: Not Currently    Comment: rare   Drug use: No   Sexual activity: Not Currently   Tobacco Counseling Counseling given: No  SDOH Screenings   Food Insecurity: No Food Insecurity (04/17/2024)  Housing: Low Risk (04/17/2024)  Transportation Needs: No Transportation Needs (04/17/2024)  Utilities: Not At Risk (04/17/2024)  Alcohol Screen: Low Risk (01/13/2024)   Depression (PHQ2-9): Low Risk (04/17/2024)  Financial Resource Strain: Low Risk (01/13/2024)  Physical Activity: Insufficiently Active (04/17/2024)  Social Connections: Moderately Integrated (04/17/2024)  Stress: No Stress Concern Present (04/17/2024)  Tobacco Use: Low Risk (04/17/2024)  Health Literacy: Adequate Health Literacy (04/17/2024)   See flowsheets for full screening details  Depression Screen PHQ 2 & 9 Depression Scale- Over the past 2 weeks, how often have you been bothered by any of the following problems? Little interest or pleasure in doing things: 0 Feeling down, depressed, or hopeless (PHQ Adolescent also includes...irritable): 0 PHQ-2 Total Score: 0 Trouble falling or staying asleep, or sleeping too much: 0 Feeling tired or having little energy: 0 Poor appetite or overeating (PHQ Adolescent also includes...weight loss): 0 Feeling bad about yourself - or that you are a failure or have let yourself or your family down: 0 Trouble concentrating on things, such as reading the newspaper or watching television (PHQ Adolescent also includes...like school work):  0 Moving or speaking so slowly that other people could have noticed. Or the opposite - being so fidgety or restless that you have been moving around a lot more than usual: 0 Thoughts that you would be better off dead, or of hurting yourself in some way: 0 PHQ-9 Total Score: 0 If you checked off any problems, how difficult have these problems made it for you to do your work, take care of things at home, or get along with other people?: Very difficult     Goals Addressed               This Visit's Progress     Remain active (pt-stated)               Objective:    Today's Vitals   04/17/24 1320  BP: 120/60  Pulse: 79  Temp: 98.2 F (36.8 C)  TempSrc: Oral  SpO2: 96%  Weight: 171 lb 3.2 oz (77.7 kg)  Height: 4' 9 (1.448 m)   Body mass index is 37.05 kg/m.  Hearing/Vision screen Hearing Screening -  Comments:: Denies hearing difficulties   Vision Screening - Comments:: Wears rx glasses - up to date with routine eye exams with  Dr Octavia Immunizations and Health Maintenance Health Maintenance  Topic Date Due   Hepatitis C Screening  Never done   Zoster Vaccines- Shingrix (2 of 2) 11/11/2022   Influenza Vaccine  11/03/2023   DTaP/Tdap/Td (2 - Td or Tdap) 11/23/2023   COVID-19 Vaccine (8 - 2025-26 season) 07/23/2024   Medicare Annual Wellness (AWV)  04/17/2025   Mammogram  12/11/2025   Colonoscopy  04/20/2026   Pneumococcal Vaccine: 50+ Years  Completed   Bone Density Scan  Completed   Meningococcal B Vaccine  Aged Out        Assessment/Plan:  This is a routine wellness examination for Jaidan.  Patient Care Team: [name] LABOR, MD as PCP - General  I have personally reviewed and noted the following in the patients chart:   Medical and social history Use of alcohol, tobacco or illicit drugs  Current medications and supplements including opioid prescriptions. Functional ability and status Nutritional status Physical activity Advanced directives List of other physicians Hospitalizations, surgeries, and ER visits in previous 12 months Vitals Screenings to include cognitive, depression, and falls Referrals and appointments  No orders of the defined types were placed in this encounter.  In addition, I have reviewed and discussed with patient certain preventive protocols, quality metrics, and best practice recommendations. A written personalized care plan for preventive services as well as general preventive health recommendations were provided to patient.   Rojelio LELON Blush, LPN   [id number]   Return in 53 weeks (on 04/23/2025).  After Visit Summary: (In Person-Printed) AVS printed and given to the patient  Nurse Notes: No voiced or noted concerns at this time "

## 2025-04-23 ENCOUNTER — Ambulatory Visit
